# Patient Record
Sex: Male | Born: 1965 | Race: White | Hispanic: No | Marital: Married | Smoking: Former smoker
Health system: Southern US, Community
[De-identification: ages and names within clinical notes are randomized; demographics above are authoritative.]

## PROBLEM LIST (undated history)

## (undated) DIAGNOSIS — Z87891 Personal history of nicotine dependence: Secondary | ICD-10-CM

## (undated) DIAGNOSIS — G4730 Sleep apnea, unspecified: Secondary | ICD-10-CM

## (undated) DIAGNOSIS — F419 Anxiety disorder, unspecified: Secondary | ICD-10-CM

## (undated) DIAGNOSIS — R7989 Other specified abnormal findings of blood chemistry: Secondary | ICD-10-CM

## (undated) DIAGNOSIS — H9319 Tinnitus, unspecified ear: Secondary | ICD-10-CM

## (undated) DIAGNOSIS — G4733 Obstructive sleep apnea (adult) (pediatric): Secondary | ICD-10-CM

## (undated) DIAGNOSIS — F32A Depression, unspecified: Secondary | ICD-10-CM

## (undated) DIAGNOSIS — L409 Psoriasis, unspecified: Secondary | ICD-10-CM

## (undated) DIAGNOSIS — F329 Major depressive disorder, single episode, unspecified: Secondary | ICD-10-CM

## (undated) DIAGNOSIS — R945 Abnormal results of liver function studies: Secondary | ICD-10-CM

## (undated) DIAGNOSIS — E669 Obesity, unspecified: Secondary | ICD-10-CM

## (undated) DIAGNOSIS — I10 Essential (primary) hypertension: Secondary | ICD-10-CM

## (undated) DIAGNOSIS — E782 Mixed hyperlipidemia: Secondary | ICD-10-CM

## (undated) HISTORY — DX: Personal history of nicotine dependence: Z87.891

## (undated) HISTORY — DX: Psoriasis, unspecified: L40.9

## (undated) HISTORY — DX: Tinnitus, unspecified ear: H93.19

## (undated) HISTORY — PX: BACK SURGERY: SHX140

## (undated) HISTORY — DX: Obesity, unspecified: E66.9

## (undated) HISTORY — DX: Abnormal results of liver function studies: R94.5

## (undated) HISTORY — DX: Other specified abnormal findings of blood chemistry: R79.89

## (undated) HISTORY — DX: Mixed hyperlipidemia: E78.2

---

## 2000-03-02 ENCOUNTER — Encounter: Payer: Self-pay | Admitting: Emergency Medicine

## 2000-03-02 ENCOUNTER — Emergency Department (HOSPITAL_COMMUNITY): Admission: EM | Admit: 2000-03-02 | Discharge: 2000-03-02 | Payer: Self-pay | Admitting: Emergency Medicine

## 2002-12-13 HISTORY — PX: CARDIAC CATHETERIZATION: SHX172

## 2003-10-04 ENCOUNTER — Encounter: Payer: Self-pay | Admitting: Emergency Medicine

## 2003-10-04 ENCOUNTER — Observation Stay (HOSPITAL_COMMUNITY): Admission: EM | Admit: 2003-10-04 | Discharge: 2003-10-04 | Payer: Self-pay | Admitting: Emergency Medicine

## 2005-06-30 ENCOUNTER — Inpatient Hospital Stay (HOSPITAL_COMMUNITY): Admission: RE | Admit: 2005-06-30 | Discharge: 2005-07-02 | Payer: Self-pay | Admitting: Neurological Surgery

## 2005-08-03 ENCOUNTER — Encounter: Admission: RE | Admit: 2005-08-03 | Discharge: 2005-08-03 | Payer: Self-pay | Admitting: Neurological Surgery

## 2005-10-05 ENCOUNTER — Encounter: Admission: RE | Admit: 2005-10-05 | Discharge: 2005-10-05 | Payer: Self-pay | Admitting: Neurological Surgery

## 2006-02-25 ENCOUNTER — Encounter: Admission: RE | Admit: 2006-02-25 | Discharge: 2006-02-25 | Payer: Self-pay | Admitting: Neurological Surgery

## 2006-05-10 ENCOUNTER — Encounter: Admission: RE | Admit: 2006-05-10 | Discharge: 2006-05-10 | Payer: Self-pay | Admitting: Neurological Surgery

## 2007-01-20 ENCOUNTER — Encounter: Admission: RE | Admit: 2007-01-20 | Discharge: 2007-01-20 | Payer: Self-pay | Admitting: Neurological Surgery

## 2007-02-10 ENCOUNTER — Emergency Department (HOSPITAL_COMMUNITY): Admission: EM | Admit: 2007-02-10 | Discharge: 2007-02-10 | Payer: Self-pay | Admitting: Emergency Medicine

## 2007-06-02 IMAGING — CR DG CHEST 2V
2 series · 2 of 2 positions shown · non-contrast
Comparison: None

CLINICAL DATA: HNP, preop radiograph
 CHEST ? TWO VIEWS:

[view not recorded (1 of 2)]
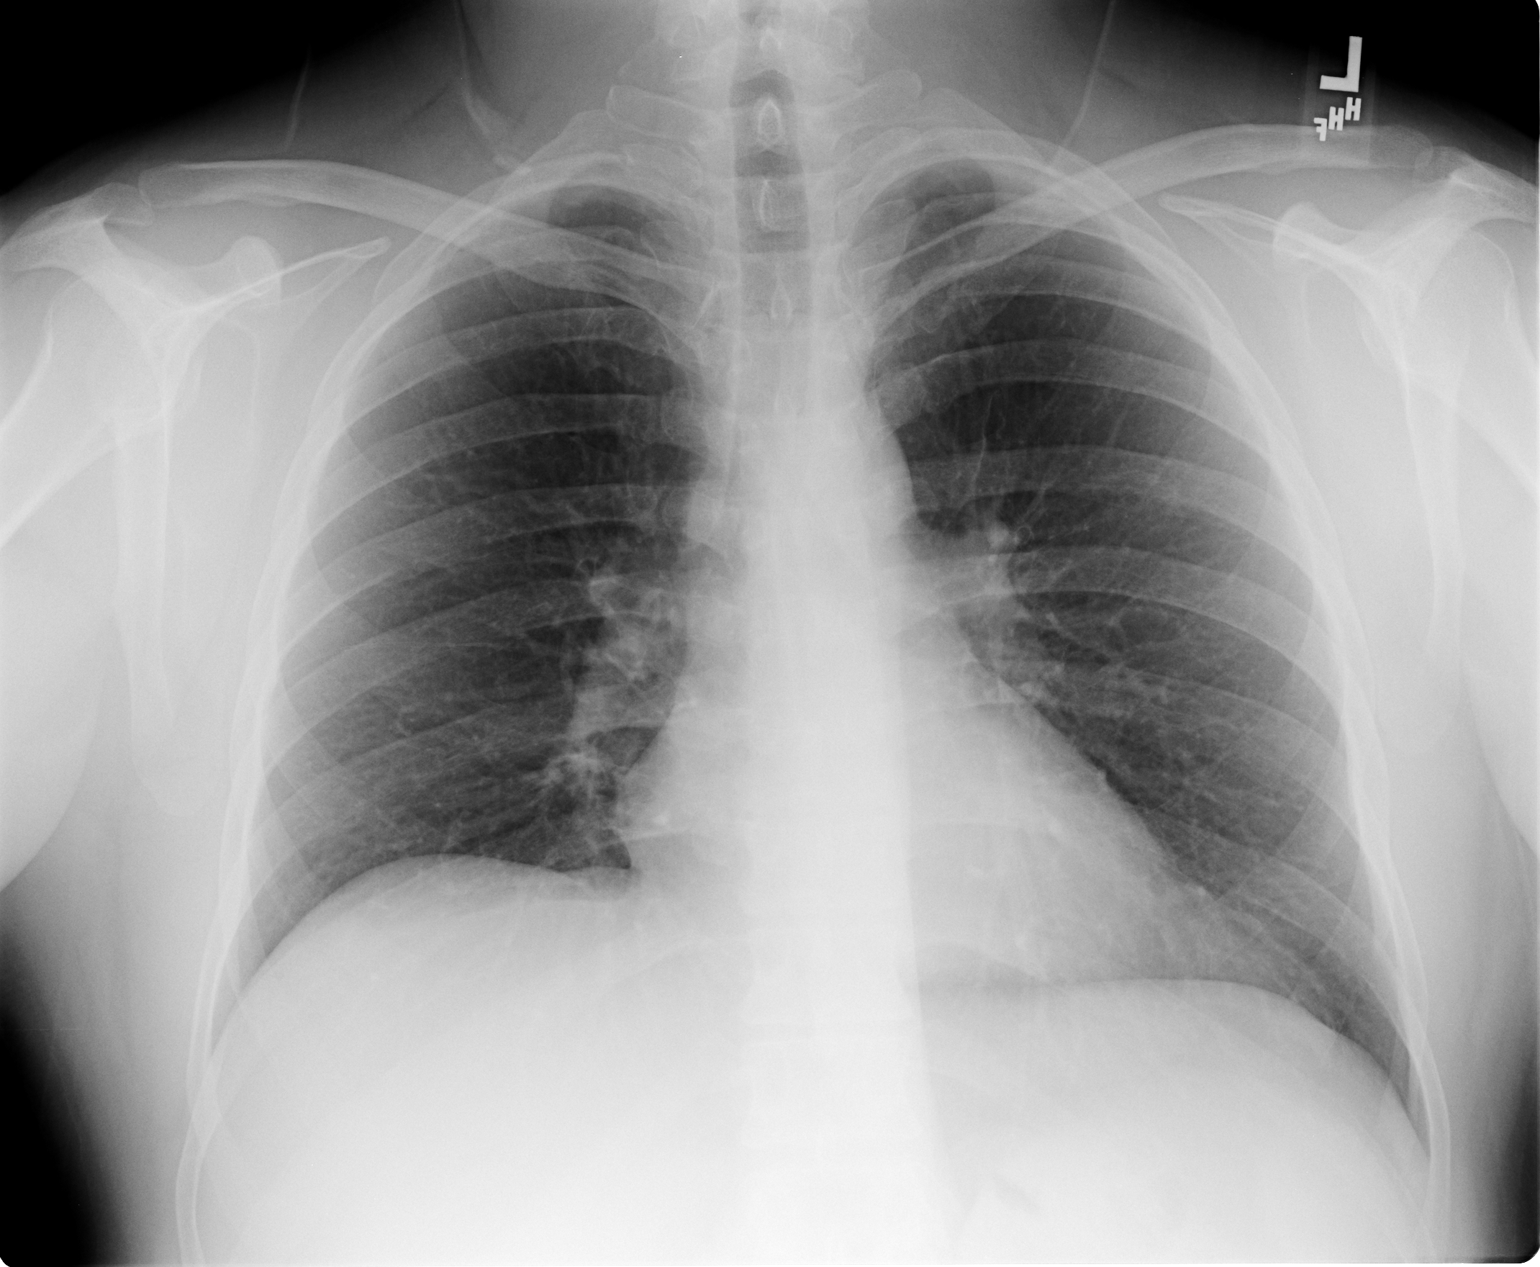

[view not recorded (2 of 2)]
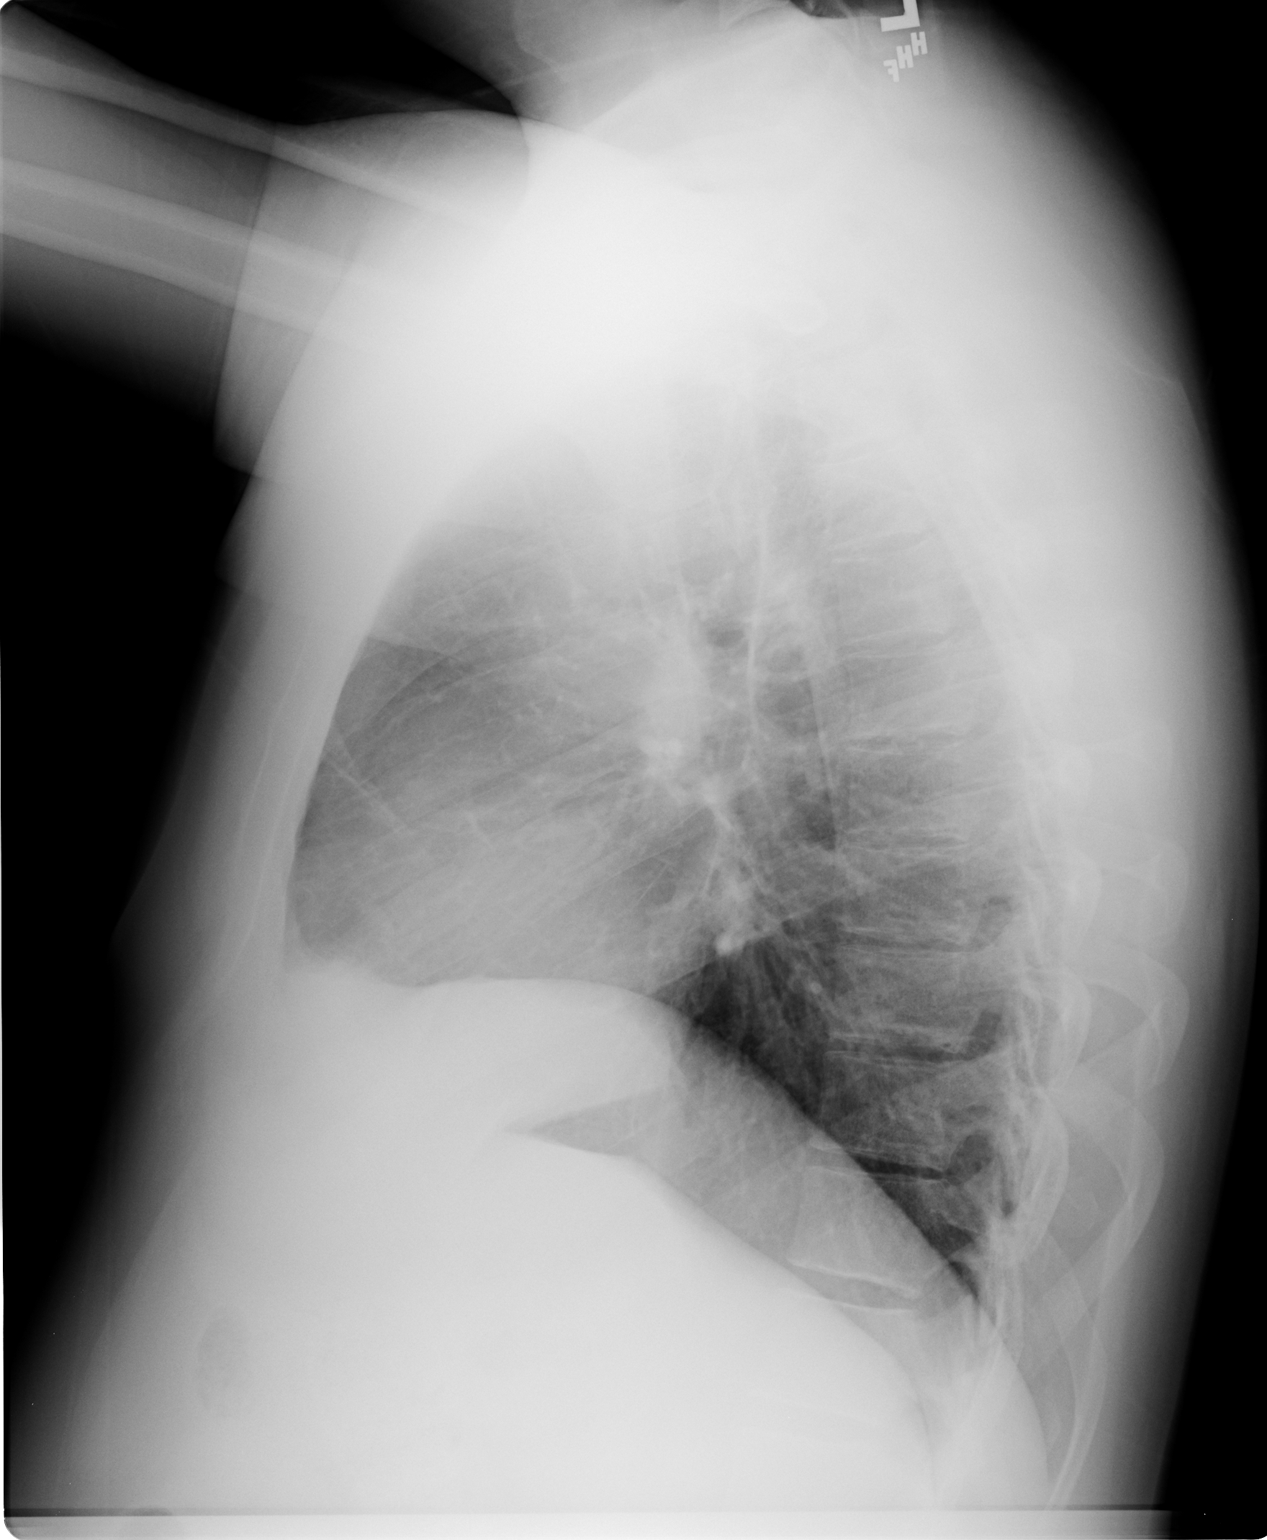

[2 of 2 positions shown; findings below may reference images not displayed]

FINDINGS: The heart size is normal.  There is no pleural effusions or pneumothorax.  No focal air space opacities are identified.
IMPRESSION: No acute cardiopulmonary abnormalities.

## 2007-06-03 IMAGING — RF DG LUMBAR SPINE 2-3V
1 series · 2 of 2 positions shown · non-contrast
Comparison: none

CLINICAL DATA: Herniated nucleus pulposa with posterior laminectomy and internal fixation
 LUMBOSACRAL SPINE ? TWO VIEWS:

[Series 1: run · 2 of 2 slices shown]
[im 1/2]
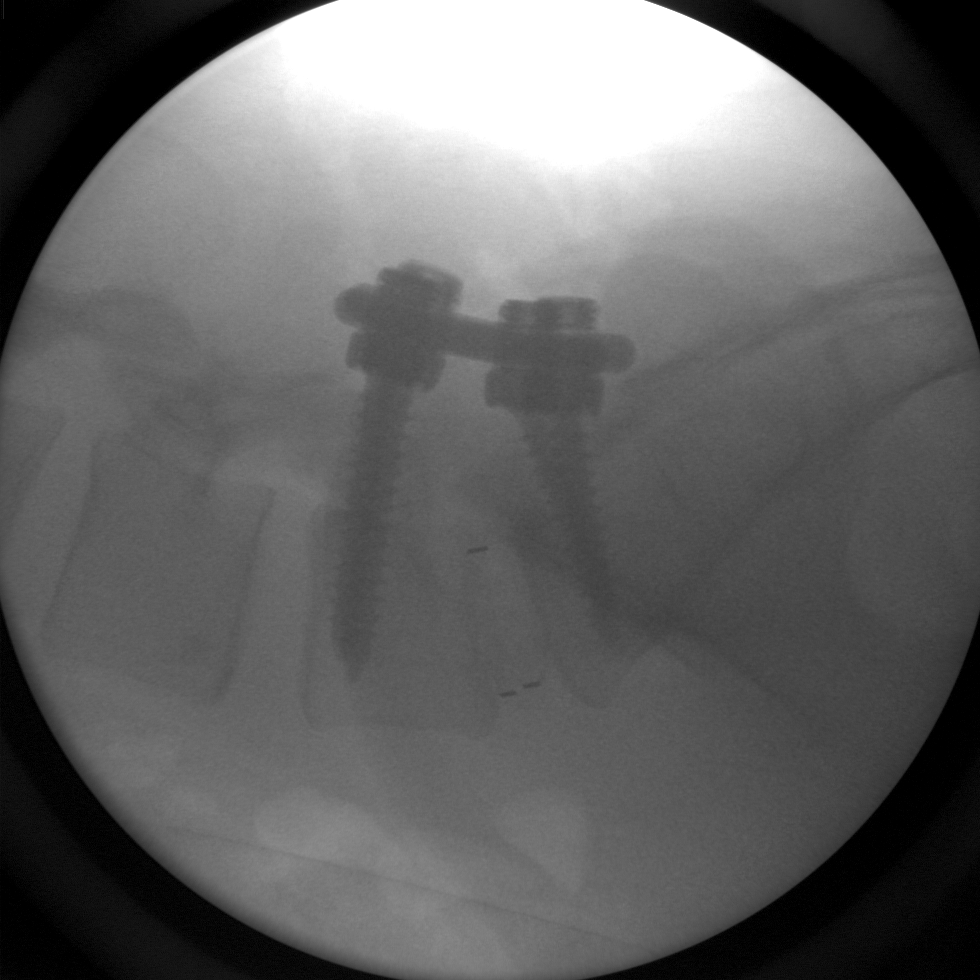
[im 2/2]
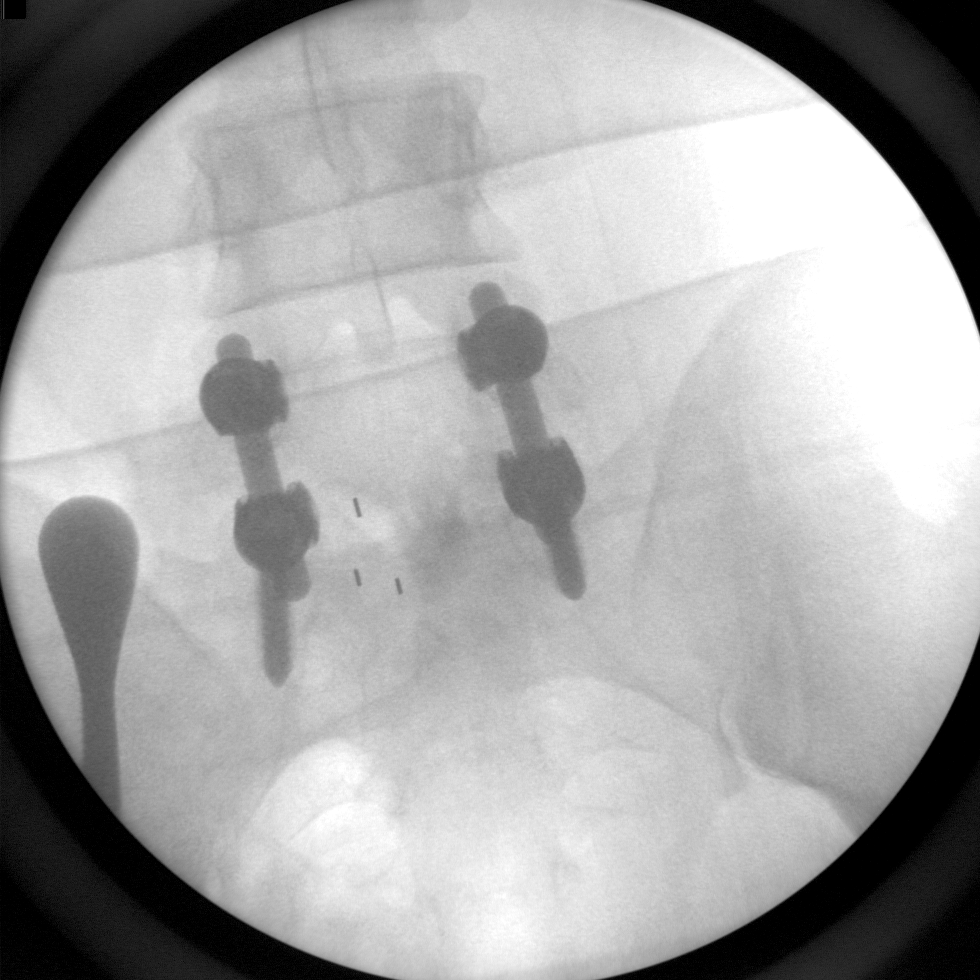

[2 of 2 positions shown; findings below may reference images not displayed]

FINDINGS: AP and lateral views of the lumbosacral spine show good position of posterior fusion devices at L5-S1.
IMPRESSION: Posterior fusion L5-S1.

## 2007-12-14 DIAGNOSIS — H9319 Tinnitus, unspecified ear: Secondary | ICD-10-CM

## 2007-12-14 HISTORY — DX: Tinnitus, unspecified ear: H93.19

## 2008-02-04 ENCOUNTER — Emergency Department (HOSPITAL_COMMUNITY): Admission: EM | Admit: 2008-02-04 | Discharge: 2008-02-04 | Payer: Self-pay | Admitting: Emergency Medicine

## 2009-04-22 ENCOUNTER — Encounter: Admission: RE | Admit: 2009-04-22 | Discharge: 2009-04-22 | Payer: Self-pay | Admitting: Neurological Surgery

## 2009-07-07 ENCOUNTER — Emergency Department (HOSPITAL_COMMUNITY): Admission: EM | Admit: 2009-07-07 | Discharge: 2009-07-07 | Payer: Self-pay | Admitting: Emergency Medicine

## 2011-04-30 NOTE — H&P (Signed)
NAME:  Allen Thomas, Allen Thomas                             ACCOUNT NO.:  [id number]   MEDICAL RECORD NO.:  [id number]                   PATIENT TYPE:  EMS   LOCATION:  [facility]                                 FACILITY:  [facility]   PHYSICIAN:  Colleen Can. Deborah Chalk, M.D.            DATE OF BIRTH:  08-24-1966   DATE OF ADMISSION:  10/04/2003  DATE OF DISCHARGE:                                HISTORY & PHYSICAL   CHIEF COMPLAINT:  Chest pain.   HISTORY OF PRESENT ILLNESS:  Mr. Scheibel is a 46 year old male who has a  known history of ongoing tobacco abuse, hyperlipidemia as well as a positive  family history for coronary disease.  He presents to the emergency  department today with left arm achiness that has been occurring over the  past three to four days.  He notes that it has basically been constant, but  he does have varying degrees of severity.  He has had no relief with  Tylenol.  Today, after awakening, he had left breast discomfort which was  very unusual for him.  He felt hot.  He had no nausea, vomiting or shortness  of breath.  He came to the emergency room where he had some relief with  aspirin and nitroglycerin.  He is subsequently admitted for further  evaluation.   PAST MEDICAL HISTORY:  1. Anxiety.  2. Hyperlipidemia.  3. Irritable bowel syndrome.   PAST SURGICAL HISTORY:  None.   CURRENT MEDICATIONS:  Librax, Ativan, Paxil, Lipitor and TriCor.   ALLERGIES:  None.   FAMILY HISTORY:  Both of his parents are in their 40's.  Father had coronary  artery bypass grafting x3 in his mid-50's.  He is currently in his early  51's.  His mother does have a history of hyperlipidemia.   SOCIAL HISTORY:  He is married, has two children.  He is employed as an  Psychologist, sport and exercise.  There is no alcohol use.  He has had tobacco  abuse of a pack a day for at least ten years, perhaps longer.   REVIEW OF SYSTEMS:  Basically as noted above and is otherwise, unremarkable.   PHYSICAL  EXAMINATION:  GENERAL:  Currently in no acute distress.  VITAL SIGNS:  Blood pressure 120/70, heart rate is in the 70's, respirations  18, he is afebrile.  SKIN:  Warm and dry, color is unremarkable.  LUNGS:  Basically clear.  HEART:  Shows a regular rhythm.  There is no chest wall tenderness.  ABDOMEN:  Soft, positive bowel sounds, nontender.  EXTREMITIES:  Without edema.  NEUROLOGIC:  Intact.  There are no gross focal deficits.   PERTINENT LABORATORY DATA:  First set of cardiac markers are negative.  EKG  is normal.  Other labs are pending.   OVERALL IMPRESSION:  1. Left arm and chest pain of questionable etiology.  2. Ongoing tobacco abuse.  3. Hyperlipidemia on combination therapy.  4. Positive family history for coronary disease.   PLAN:  He will be admitted to the hospital.  Will continue to check cardiac  enzymes.  Will place him on nitroglycerin and heparin as well as PPI  therapy.  Will make plans for him to undergo cardiac catheterization later  on today.       Juanell Fairly C. Earl Gala, N.P.                 Colleen Can. Deborah Chalk, M.D.    LCO/MEDQ  D:  10/04/2003  T:  10/04/2003  Job:  [id number]   cc:   Caryn Bee L. Little, M.D.  [address]  [place]  [place] [address]  Fax: [phone]   Colleen Can. Deborah Chalk, M.D.  Fax: [phone]

## 2011-04-30 NOTE — Cardiovascular Report (Signed)
NAME:  Allen Thomas, Allen Thomas                             ACCOUNT NO.:  [id number]   MEDICAL RECORD NO.:  [id number]                   PATIENT TYPE:  INP   LOCATION:  1826                                 FACILITY:  [facility]   PHYSICIAN:  Colleen Can. Deborah Chalk, M.D.            DATE OF BIRTH:  11-24-1966   DATE OF PROCEDURE:  10/04/2003  DATE OF DISCHARGE:                              CARDIAC CATHETERIZATION   PROCEDURES PERFORMED:  1. Left heart catheterization.  2. Selective coronary angiography.  3. Left ventricular angiography.   CARDIOLOGIST:  Colleen Can. Deborah Chalk, M.D.   HISTORY:  Mr. Livingood is a 45 year old gentleman who presents with chest  pain that has been persistent over the last three to four days.  He has a  strongly positive family history of heart disease as well as a history of  hyperlipidemia.   TYPE AND SITE OF ENTRY:  Percutaneous right femoral artery.   CATHETERS:  Six [place] 4-curved Judkins right and left coronary catheters.  Six French pigtail ventriculographic catheter with Perclose.   CONTRAST MATERIAL:  Omnipaque.   MEDICATIONS:  Medications given prior to the procedure; Valium 10 mg p.o.   Medications given during the procedure; Versed 3 mg IV and Ancef 1 gram IV.   COMMENTS:  The patient tolerated the procedure well.   HEMODYNAMIC DATA:  The aortic pressure was 120/88.  LV was 128/6-12.  There  is no aortic valve gradient noted on pullback.   ANGIOGRAPHIC DATA:  1. Left ventricular angiogram was performed in the RAO position.  Overall     cardiac size and silhouette were normal.  The global ejection fraction is     55-60%.  There is no mitral regurgitation, intracardiac calcifications or     intracavitary filling defect.   1. Coronary Arteries:  The coronary arteries the arise and distribute     normally.   1. Left Main:  The left main coronary artery is normal.   1. Left Anterior Descending:  The left anterior descending is normal.  There     is a high  large diagonal vessel; it is normal.   1. Left Circumflex:  The left circumflex is normal.   1. Right Coronary Artery:  The right coronary artery is a dominant vessel.     It is normal.   OVERALL IMPRESSION:  1. Normal left ventricular function.  2. Normal coronary arteries.   DISCUSSION:  In light of these findings it is felt that Mr. Eble chest  pain syndrome is not cardiac in nature.  We will be able to manage him on an  outpatient basis.                                                 Colleen Can. Deborah Chalk,  M.D.    SNT/MEDQ  D:  10/04/2003  T:  10/05/2003  Job:  [id number]   cc:   Caryn Bee L. Little, M.D.  [address]  [place]  [place] [address]  Fax: [phone]

## 2011-04-30 NOTE — Op Note (Signed)
Allen Thomas, Allen Thomas                 ACCOUNT NO.:  [id number]   MEDICAL RECORD NO.:  [id number]          PATIENT TYPE:  INP   LOCATION:  3007                         FACILITY:  [facility]   PHYSICIAN:  Tia Alert, MD     DATE OF BIRTH:  18-Aug-1966   DATE OF PROCEDURE:  06/30/2005  DATE OF DISCHARGE:                                 OPERATIVE REPORT   PREOPERATIVE DIAGNOSIS:  Lumbar degenerative disc disease L5-S1, with large  midline disc herniation, with back and leg pain.   POSTOPERATIVE DIAGNOSIS:  Lumbar degenerative disc disease L5-S1, with large  midline disc herniation, with back and leg pain.   PROCEDURES:  1.  Decompressive laminectomy, hemifacetectomy, and foraminotomies L5-S1,      requiring greater work than is usually required for a typical PLIF      procedure.  2.  Posterior lumbar interbody fusion L5-S1 utilizing an 8 x 26 mm Telamon      interbody cage packed with local autograft and DBX putty, and also a      tangent allograft bone wedge.  3.  Intertransverse arthrodesis L5-S1, utilizing local autograft and DBX      putty.  4.  Nonsegmental fixation L5-S1 utilizing the Legacy pedicle screw system.   SURGEON:  Tia Alert, M.D.   ASSISTANT:  Kathaleen Maser. Pool, M.D.   ANESTHESIA:  General endotracheal.   COMPLICATIONS:  None apparent.   INDICATIONS FOR PROCEDURE:  Allen Thomas is a 45 year old white male who was  referred for neurosurgical consultation regarding a larger midline disc  herniation with degenerative disc disease at L5-S1.  He had back pain worse  than leg pain that sounded discogenic in nature.  He had a very large disc  herniation causing significant spinal stenosis.  I recommended a posterior  lumbar interbody fusion at L5-S1.  He understood the risks and benefits and  expected outcome and wished to proceed.   DESCRIPTION OF THE PROCEDURE:  The patient was taken to the operating room,  and after induction of adequate general endotracheal  anesthesia, he was  rolled into the prone position on chest rolls, and all pressure points were  padded.  His lumbar region was prepped with DuraPrep and then draped in the  usual sterile fashion.  10 cc of local anesthesia was injected, and then a  dorsal midline incision was made and carried down to the lumbosacral fascia.  The fascia was opened, and the paraspinous musculature was taken down in a  subperiosteal fashion to expose the L5-S1 interspace.  Intraoperative  fluoroscopy confirmed my level, and then I used the Leksell rongeur to  remove the spinous process, and then I used it along with Kerrison punches  to perform a complete laminectomy, hemifacetectomy, and bilateral  foraminotomies at L5-S1.  The L5 and S1 nerve roots were identified and  followed out into the foramina to the medial pedicle wall.  Once the  decompression was complete, I coagulated the epidural venous vasculature and  cut this sharply.  We then performed the initial diskectomy with pituitary  rongeurs after the annulus  was incised bilaterally.  We then distracted the  disc space up to 8 mm, and then used on each side a combination of the  rotating cutter, round scraper, curettes, and pituitary rongeurs to perform  complete diskectomies.  The chisel was used to prepare the endplates for  arthrodesis.  He did have a large midline spur.  It was a calcified disc  herniation.  This required some significant retraction of the nerve roots in  order to get close to the midline in order to decompress the midline from a  large calcified disc.  Once the diskectomy was complete and the  decompression was done, and the nerve free, we used an 8 mm x 26 mm Telamon  interbody cage packed with local autograft and DBX putty and tapped this  into position on the patient's right side, packed the midline with local  autograft and DBX putty, and used an 8 x 26 mm tangent interbody bone wedge  on the left side.  Once the PLIF was  complete, we turned our attention to  the nonsegmental fixation.  We localized the pedicle screw entry zones  bilaterally, probed each pedicle, tapped each pedicle with 5.5 tap, palpated  each pedicle to ensure no break in the cortex, and then used 65 x 45 mm  pedicle screws into the L5 pedicles and 65 x 35 mm pedicle screws into the  pedicles of S1 bilaterally.  We then decorticated the transverse processes  and placed a mixture of autograft and DBX putty out over these for  intertransverse arthrodesis.  We then used two 40 mm lordotic rods and  placed these into the long T axial screw heads of the pedicle screws at L5  and S1 and locked these into position with the locking caps and the anti-  torque device after achieving compression.  We then dried all bleeding  points, irrigated with saline solution, lined the dura with Gelfoam, and  then closed the muscle and the fascia with interrupted #1 Vicryl.  A medium  Hemovac drain was placed prior to this closure.  We closed the subcutaneous  and subcuticular tissue with 2-0 and 3-0 Vicryl, and closed the skin with  Benzoin and Steri-Strips.  The drapes were removed.  A sterile dressing was  applied.  The patient was awakened from general anesthesia and transported  to the recovery room in stable condition.  At the end of the procedure, all  sponge, needle, and instrument counts were correct.       DSJ/MEDQ  D:  06/30/2005  T:  06/30/2005  Job:  [id number]

## 2011-09-06 LAB — COMPREHENSIVE METABOLIC PANEL
ALT: 61 — ABNORMAL HIGH
AST: 40 — ABNORMAL HIGH
Albumin: 4
Alkaline Phosphatase: 69
BUN: 16
CO2: 27
Creatinine, Ser: 0.84
GFR calc Af Amer: >60
GFR calc non Af Amer: >60

## 2011-09-06 LAB — CBC
MCHC: 35.7
Platelets: 368
RDW: 12.1
WBC: 7.7

## 2011-09-06 LAB — URINALYSIS, ROUTINE W REFLEX MICROSCOPIC
Bilirubin Urine: NEGATIVE
Hgb urine dipstick: NEGATIVE
Ketones, ur: NEGATIVE
Protein, ur: NEGATIVE

## 2011-09-06 LAB — DIFFERENTIAL
Eosinophils Absolute: 0.1
Lymphocytes Relative: 43
Monocytes Relative: 7
Neutro Abs: 3.7

## 2011-09-06 LAB — LIPASE, BLOOD: Lipase: 35

## 2012-03-28 ENCOUNTER — Other Ambulatory Visit: Payer: Self-pay | Admitting: Orthopaedic Surgery

## 2012-03-28 DIAGNOSIS — M25562 Pain in left knee: Secondary | ICD-10-CM

## 2012-04-01 ENCOUNTER — Ambulatory Visit
Admission: RE | Admit: 2012-04-01 | Discharge: 2012-04-01 | Disposition: A | Discharge status: Home / Self Care | Payer: BC Managed Care – PPO | Source: Ambulatory Visit | Attending: Orthopaedic Surgery | Admitting: Orthopaedic Surgery

## 2012-04-01 DIAGNOSIS — M25562 Pain in left knee: Secondary | ICD-10-CM

## 2012-05-23 ENCOUNTER — Ambulatory Visit: Payer: BC Managed Care – PPO | Attending: Orthopaedic Surgery | Admitting: Physical Therapy

## 2012-05-23 DIAGNOSIS — M25569 Pain in unspecified knee: Secondary | ICD-10-CM | POA: Insufficient documentation

## 2012-05-23 DIAGNOSIS — M25669 Stiffness of unspecified knee, not elsewhere classified: Secondary | ICD-10-CM | POA: Insufficient documentation

## 2012-05-23 DIAGNOSIS — IMO0001 Reserved for inherently not codable concepts without codable children: Secondary | ICD-10-CM | POA: Insufficient documentation

## 2012-05-30 ENCOUNTER — Ambulatory Visit: Payer: BC Managed Care – PPO | Admitting: Physical Therapy

## 2012-06-01 ENCOUNTER — Ambulatory Visit: Payer: BC Managed Care – PPO

## 2012-06-06 ENCOUNTER — Ambulatory Visit: Payer: BC Managed Care – PPO | Admitting: Physical Therapy

## 2012-06-13 ENCOUNTER — Ambulatory Visit: Payer: BC Managed Care – PPO | Admitting: Rehabilitation

## 2012-07-24 ENCOUNTER — Encounter (HOSPITAL_COMMUNITY): Payer: Self-pay | Admitting: *Deleted

## 2012-07-24 ENCOUNTER — Other Ambulatory Visit (HOSPITAL_COMMUNITY): Payer: Self-pay | Admitting: Orthopaedic Surgery

## 2012-07-25 ENCOUNTER — Encounter (HOSPITAL_COMMUNITY): Payer: Self-pay | Admitting: Pharmacy Technician

## 2012-07-27 ENCOUNTER — Encounter (HOSPITAL_COMMUNITY): Payer: Self-pay | Admitting: Certified Registered Nurse Anesthetist

## 2012-07-27 ENCOUNTER — Ambulatory Visit (HOSPITAL_COMMUNITY): Payer: BC Managed Care – PPO | Admitting: Certified Registered Nurse Anesthetist

## 2012-07-27 ENCOUNTER — Encounter (HOSPITAL_COMMUNITY)
Admission: RE | Disposition: A | Discharge status: Home / Self Care | Payer: Self-pay | Source: Ambulatory Visit | Attending: Orthopaedic Surgery

## 2012-07-27 ENCOUNTER — Ambulatory Visit (HOSPITAL_COMMUNITY): Payer: BC Managed Care – PPO

## 2012-07-27 ENCOUNTER — Encounter (HOSPITAL_COMMUNITY): Payer: Self-pay

## 2012-07-27 ENCOUNTER — Ambulatory Visit (HOSPITAL_COMMUNITY)
Admission: RE | Admit: 2012-07-27 | Discharge: 2012-07-27 | Disposition: A | Discharge status: Home / Self Care | Payer: BC Managed Care – PPO | Source: Ambulatory Visit | Attending: Orthopaedic Surgery | Admitting: Orthopaedic Surgery

## 2012-07-27 DIAGNOSIS — M2240 Chondromalacia patellae, unspecified knee: Secondary | ICD-10-CM | POA: Insufficient documentation

## 2012-07-27 DIAGNOSIS — X58XXXA Exposure to other specified factors, initial encounter: Secondary | ICD-10-CM | POA: Insufficient documentation

## 2012-07-27 DIAGNOSIS — R7309 Other abnormal glucose: Secondary | ICD-10-CM | POA: Insufficient documentation

## 2012-07-27 DIAGNOSIS — Z79899 Other long term (current) drug therapy: Secondary | ICD-10-CM | POA: Insufficient documentation

## 2012-07-27 DIAGNOSIS — M2390 Unspecified internal derangement of unspecified knee: Secondary | ICD-10-CM

## 2012-07-27 DIAGNOSIS — IMO0002 Reserved for concepts with insufficient information to code with codable children: Secondary | ICD-10-CM | POA: Insufficient documentation

## 2012-07-27 DIAGNOSIS — G4730 Sleep apnea, unspecified: Secondary | ICD-10-CM | POA: Insufficient documentation

## 2012-07-27 DIAGNOSIS — M659 Unspecified synovitis and tenosynovitis, unspecified site: Secondary | ICD-10-CM | POA: Insufficient documentation

## 2012-07-27 DIAGNOSIS — I10 Essential (primary) hypertension: Secondary | ICD-10-CM | POA: Insufficient documentation

## 2012-07-27 HISTORY — DX: Sleep apnea, unspecified: G47.30

## 2012-07-27 HISTORY — DX: Essential (primary) hypertension: I10

## 2012-07-27 HISTORY — DX: Depression, unspecified: F32.A

## 2012-07-27 HISTORY — DX: Major depressive disorder, single episode, unspecified: F32.9

## 2012-07-27 HISTORY — PX: KNEE ARTHROSCOPY: SHX127

## 2012-07-27 HISTORY — DX: Anxiety disorder, unspecified: F41.9

## 2012-07-27 LAB — BASIC METABOLIC PANEL
CO2: 28 mEq/L (ref 19–32)
Chloride: 106 mEq/L (ref 96–112)
GFR calc Af Amer: >90 mL/min (ref >90)
Sodium: 142 mEq/L (ref 135–145)

## 2012-07-27 LAB — SURGICAL PCR SCREEN
MRSA, PCR: NEGATIVE
Staphylococcus aureus: NEGATIVE

## 2012-07-27 LAB — GLUCOSE, CAPILLARY: Glucose-Capillary: 84 mg/dL (ref 70–99)

## 2012-07-27 LAB — CBC
Platelets: 299 10*3/uL (ref 150–400)
RBC: 4.38 MIL/uL (ref 4.22–5.81)
RDW: 12.2 % (ref 11.5–15.5)
WBC: 5.8 10*3/uL (ref 4.0–10.5)

## 2012-07-27 SURGERY — ARTHROSCOPY, KNEE
Anesthesia: General | Laterality: Left | Wound class: Clean

## 2012-07-27 MED ORDER — PROPOFOL 10 MG/ML IV EMUL
INTRAVENOUS | Status: DC | PRN
  Administered 2012-07-27: 100 mg via INTRAVENOUS
  Administered 2012-07-27: 300 mg via INTRAVENOUS

## 2012-07-27 MED ORDER — MIDAZOLAM HCL 5 MG/5ML IJ SOLN
INTRAMUSCULAR | Status: DC | PRN
  Administered 2012-07-27: 2 mg via INTRAVENOUS

## 2012-07-27 MED ORDER — MUPIROCIN 2 % EX OINT
TOPICAL_OINTMENT | Freq: Two times a day (BID) | CUTANEOUS | Status: DC
  Filled 2012-07-27: qty 22

## 2012-07-27 MED ORDER — OXYCODONE-ACETAMINOPHEN 5-325 MG PO TABS: 1.0000 | ORAL_TABLET | ORAL | Status: AC | PRN

## 2012-07-27 MED ORDER — ONDANSETRON HCL 4 MG/2ML IJ SOLN
INTRAMUSCULAR | Status: DC | PRN
  Administered 2012-07-27: 4 mg via INTRAVENOUS

## 2012-07-27 MED ORDER — MEPERIDINE HCL 50 MG/ML IJ SOLN: 6.2500 mg | INTRAMUSCULAR | Status: DC | PRN

## 2012-07-27 MED ORDER — HYDROMORPHONE HCL PF 1 MG/ML IJ SOLN
0.2500 mg | INTRAMUSCULAR | Status: DC | PRN
  Administered 2012-07-27 (×2): 0.5 mg via INTRAVENOUS

## 2012-07-27 MED ORDER — BUPIVACAINE HCL (PF) 0.25 % IJ SOLN
INTRAMUSCULAR | Status: AC
  Filled 2012-07-27: qty 30

## 2012-07-27 MED ORDER — PROMETHAZINE HCL 25 MG/ML IJ SOLN
6.2500 mg | INTRAMUSCULAR | Status: DC | PRN
Start: 2012-07-27 — End: 2012-07-27

## 2012-07-27 MED ORDER — ACETAMINOPHEN 10 MG/ML IV SOLN: 1000.0000 mg | Freq: Once | INTRAVENOUS | Status: DC | PRN

## 2012-07-27 MED ORDER — LACTATED RINGERS IV SOLN
INTRAVENOUS | Status: DC
  Administered 2012-07-27: 1000 mL via INTRAVENOUS

## 2012-07-27 MED ORDER — HYDROMORPHONE HCL PF 1 MG/ML IJ SOLN
INTRAMUSCULAR | Status: AC
  Filled 2012-07-27: qty 1

## 2012-07-27 MED ORDER — BUPIVACAINE HCL (PF) 0.5 % IJ SOLN
INTRAMUSCULAR | Status: DC | PRN
  Administered 2012-07-27: 30 mL

## 2012-07-27 MED ORDER — FENTANYL CITRATE 0.05 MG/ML IJ SOLN
INTRAMUSCULAR | Status: DC | PRN
  Administered 2012-07-27 (×5): 50 ug via INTRAVENOUS

## 2012-07-27 MED ORDER — OXYCODONE HCL 5 MG PO TABS: 5.0000 mg | ORAL_TABLET | Freq: Once | ORAL | Status: DC | PRN

## 2012-07-27 MED ORDER — CEFAZOLIN SODIUM-DEXTROSE 2-3 GM-% IV SOLR
2.0000 g | INTRAVENOUS | Status: AC
  Administered 2012-07-27: 2 g via INTRAVENOUS

## 2012-07-27 MED ORDER — MORPHINE SULFATE 4 MG/ML IJ SOLN
INTRAMUSCULAR | Status: AC
  Filled 2012-07-27: qty 1

## 2012-07-27 MED ORDER — LACTATED RINGERS IR SOLN
Status: DC | PRN
  Administered 2012-07-27 (×2): 3000 mL

## 2012-07-27 MED ORDER — OXYCODONE HCL 5 MG/5ML PO SOLN
5.0000 mg | Freq: Once | ORAL | Status: DC | PRN
  Filled 2012-07-27: qty 5

## 2012-07-27 MED ORDER — BUPIVACAINE HCL (PF) 0.5 % IJ SOLN
INTRAMUSCULAR | Status: AC
  Filled 2012-07-27: qty 30

## 2012-07-27 MED ORDER — MORPHINE SULFATE 4 MG/ML IJ SOLN
INTRAMUSCULAR | Status: DC | PRN
  Administered 2012-07-27: 4 mg via INTRAVENOUS

## 2012-07-27 MED ORDER — CEFAZOLIN SODIUM-DEXTROSE 2-3 GM-% IV SOLR
INTRAVENOUS | Status: AC
  Filled 2012-07-27: qty 50

## 2012-07-27 MED ORDER — LIDOCAINE HCL (CARDIAC) 20 MG/ML IV SOLN
INTRAVENOUS | Status: DC | PRN
  Administered 2012-07-27: 100 mg via INTRAVENOUS

## 2012-07-27 SURGICAL SUPPLY — 25 items
BLADE CUDA SHAVER 3.5 (BLADE) ×2 IMPLANT
CLOTH BEACON ORANGE TIMEOUT ST (SAFETY) ×2 IMPLANT
CUFF TOURN SGL QUICK 34 (TOURNIQUET CUFF) ×2
CUFF TRNQT CYL 34X4X40X1 (TOURNIQUET CUFF) ×1 IMPLANT
DRAPE U-SHAPE 47X51 STRL (DRAPES) ×2 IMPLANT
DRSG PAD ABDOMINAL 8X10 ST (GAUZE/BANDAGES/DRESSINGS) ×2 IMPLANT
DURAPREP 26ML APPLICATOR (WOUND CARE) ×2 IMPLANT
GAUZE XEROFORM 1X8 LF (GAUZE/BANDAGES/DRESSINGS) ×2 IMPLANT
GLOVE BIOGEL PI IND STRL 8 (GLOVE) ×1 IMPLANT
GLOVE BIOGEL PI INDICATOR 8 (GLOVE) ×1
GLOVE ORTHO TXT STRL SZ7.5 (GLOVE) ×2 IMPLANT
GOWN STRL REIN XL XLG (GOWN DISPOSABLE) ×2 IMPLANT
IV LACTATED RINGER IRRG 3000ML (IV SOLUTION) ×8
IV LR IRRIG 3000ML ARTHROMATIC (IV SOLUTION) ×4 IMPLANT
MANIFOLD NEPTUNE II (INSTRUMENTS) ×2 IMPLANT
PACK ARTHROSCOPY WL (CUSTOM PROCEDURE TRAY) ×2 IMPLANT
PADDING CAST COTTON 6X4 STRL (CAST SUPPLIES) ×2 IMPLANT
POSITIONER SURGICAL ARM (MISCELLANEOUS) ×4 IMPLANT
SUT ETHILON 4 0 PS 2 18 (SUTURE) ×2 IMPLANT
SYR 20CC LL (SYRINGE) ×2 IMPLANT
SYR CONTROL 10ML LL (SYRINGE) ×2 IMPLANT
TOWEL OR 17X26 10 PK STRL BLUE (TOWEL DISPOSABLE) ×4 IMPLANT
TUBING CONNECTING 10 (TUBING) ×2 IMPLANT
WAND 90 DEG TURBOVAC W/CORD (SURGICAL WAND) IMPLANT
WRAP KNEE MAXI GEL POST OP (GAUZE/BANDAGES/DRESSINGS) ×2 IMPLANT

## 2012-07-27 NOTE — Transfer of Care (Signed)
Immediate Anesthesia Transfer of Care Note  Patient: Allen Thomas  Procedure(s) Performed: Procedure(s) (LRB): ARTHROSCOPY KNEE (Left)  Patient Location: PACU  Anesthesia Type: General  Level of Consciousness: sedated, patient cooperative and responds to stimulaton  Airway & Oxygen Therapy: Patient Spontanous Breathing and Patient connected to face mask oxgen  Post-op Assessment: Report given to PACU RN and Post -op Vital signs reviewed and stable  Post vital signs: Reviewed and stable  Complications: No apparent anesthesia complications

## 2012-07-27 NOTE — Anesthesia Postprocedure Evaluation (Signed)
Anesthesia Post Note  Patient: Allen Thomas  Procedure(s) Performed: Procedure(s) (LRB): ARTHROSCOPY KNEE (Left)  Anesthesia type: General  Patient location: PACU  Post pain: Pain level controlled  Post assessment: Post-op Vital signs reviewed  Last Vitals: BP 124/84  Pulse 97  Temp 37 C (Oral)  Resp 16  Ht 5\' 11"  (1.803 m)  Wt 214 lb 6 oz (97.24 kg)  BMI 29.90 kg/m2  SpO2 97%  Post vital signs: Reviewed  Level of consciousness: sedated  Complications: No apparent anesthesia complications

## 2012-07-27 NOTE — Anesthesia Preprocedure Evaluation (Addendum)
Anesthesia Evaluation  Patient identified by MRN, date of birth, ID band Patient awake    Airway Mallampati: I TM Distance: >3 FB Neck ROM: Full    Dental  (+) Teeth Intact and Dental Advisory Given   Pulmonary sleep apnea ,  breath sounds clear to auscultation  Pulmonary exam normal       Cardiovascular hypertension, Pt. on medications Rhythm:Regular Rate:Normal     Neuro/Psych    GI/Hepatic   Endo/Other    Renal/GU      Musculoskeletal   Abdominal (+)  Abdomen: soft.    Peds  Hematology   Anesthesia Other Findings   Reproductive/Obstetrics                          Anesthesia Physical Anesthesia Plan  ASA: III  Anesthesia Plan: General   Post-op Pain Management:    Induction: Intravenous  Airway Management Planned: LMA  Additional Equipment:   Intra-op Plan:   Post-operative Plan: Extubation in OR  Informed Consent: I have reviewed the patients History and Physical, chart, labs and discussed the procedure including the risks, benefits and alternatives for the proposed anesthesia with the patient or authorized representative who has indicated his/her understanding and acceptance.   Dental advisory given  Plan Discussed with: CRNA and Surgeon  Anesthesia Plan Comments:        Anesthesia Quick Evaluation

## 2012-07-27 NOTE — Brief Op Note (Signed)
07/27/2012  12:59 PM  PATIENT:  Allen Thomas  46 y.o. male  PRE-OPERATIVE DIAGNOSIS:  Left knee internal derangement  POST-OPERATIVE DIAGNOSIS:  Left knee internal derangement  PROCEDURE:  Procedure(s) (LRB): ARTHROSCOPY KNEE (Left)' partial medical meniscectomy  SURGEON:  Surgeon(s) and Role:    * Kathryne Hitch, MD - Primary  PHYSICIAN ASSISTANT:   ASSISTANTS: none   ANESTHESIA:   local and general  EBL:  Total I/O In: 1000 [I.V.:1000] Out: -   BLOOD ADMINISTERED:none  DRAINS: none   LOCAL MEDICATIONS USED:  MARCAINE     SPECIMEN:  No Specimen  DISPOSITION OF SPECIMEN:  N/A  COUNTS:  YES  TOURNIQUET:  * No tourniquets in log *  DICTATION: .Other Dictation: Dictation Number [id number]  PLAN OF CARE: Discharge to home after PACU  PATIENT DISPOSITION:  PACU - hemodynamically stable.   Delay start of Pharmacological VTE agent (>24hrs) due to surgical blood loss or risk of bleeding: not applicable

## 2012-07-27 NOTE — Preoperative (Signed)
Beta Blockers   Reason not to administer Beta Blockers:Not Applicable 

## 2012-07-27 NOTE — H&P (Signed)
Allen Thomas is an 46 y.o. male.   Chief Complaint:   Severe left knee pain with locking and catching HPI:   46 yo male with worsening left knee pain.  Has had rest, NSAIDs, injections and time with no improvement.  A MRI showed no significant abnormality, but his symptoms are worsening.  He wishes to proceed with a left knee arthroscopy to assess what may be going on given the failure of conservative treatment and his symptoms.  He understands the risks of blood loss, nerve and cartilage injury as well as DVT.  Past Medical History  Diagnosis Date  . Hypertension   . Anxiety   . Depression   . Diabetes mellitus     prediabetic   . Sleep apnea     settings at 11     Past Surgical History  Procedure Date  . Back surgery     L5-S1 back surgery     History reviewed. No pertinent family history. Social History:  reports that he has quit smoking. He has quit using smokeless tobacco. He reports that he drinks alcohol. He reports that he does not use illicit drugs.  Allergies: No Known Allergies  Medications Prior to Admission  Medication Sig Dispense Refill  . amLODipine (NORVASC) 10 MG tablet Take 10 mg by mouth every morning.       . Choline Fenofibrate (TRILIPIX) 135 MG capsule Take 135 mg by mouth every evening.       . clidinium-chlordiazePOXIDE (LIBRAX) 2.5-5 MG per capsule Take 1 capsule by mouth 2 (two) times daily.      Marland Kitchen Coal Tar Extract (PSORIASIN) 1.25 % GEL Apply topically as needed. 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 lisinopril (PRINIVIL,ZESTRIL) 10 MG tablet Take 10 mg by mouth every morning.       Marland Kitchen LORazepam (ATIVAN) 0.5 MG tablet Take 0.25 mg by mouth every 6 (six) hours as needed. Anxiety      . omega-3 acid ethyl esters (LOVAZA) 1 G capsule Take 4 g by mouth every evening.       Marland Kitchen PARoxetine (PAXIL) 40 MG tablet Take 40 mg by mouth every evening.       . rosuvastatin (CRESTOR) 40 MG tablet Take 40 mg by mouth every evening.       . traMADol (ULTRAM) 50 MG tablet Take  50 mg by mouth every 6 (six) hours as needed. Pain        No results found for this or any previous visit (from the past 48 hour(s)). No results found.  Review of Systems  All other systems reviewed and are negative.    Blood pressure 130/81, pulse 84, temperature 98.6 F (37 C), temperature source Oral, resp. rate 18, height 5\' 11"  (1.803 m), weight 97.24 kg (214 lb 6 oz), SpO2 97.00%. Physical Exam  Constitutional: He is oriented to person, place, and time. He appears well-developed and well-nourished.  HENT:  Head: Normocephalic and atraumatic.  Eyes: EOM are normal. Pupils are equal, round, and reactive to light.  Neck: Normal range of motion. Neck supple.  Cardiovascular: Normal rate and regular rhythm.   Respiratory: Effort normal and breath sounds normal.  GI: Soft. Bowel sounds are normal.  Musculoskeletal:       Left knee: tenderness found. Medial joint line tenderness noted.  Neurological: He is alert and oriented to person, place, and time.  Skin: Skin is warm and dry.  Psychiatric: He has a normal mood and affect.  Assessment/Plan Severe left knee pain 1) due to the failure of conservative treatment, we will proceed with a diagnostic and hopefully therapeutic left knee arthroscopy  BLACKMAN,CHRISTOPHER Y 07/27/2012, 10:03 AM

## 2012-07-28 ENCOUNTER — Encounter (HOSPITAL_COMMUNITY): Payer: Self-pay | Admitting: Orthopaedic Surgery

## 2012-07-28 NOTE — Op Note (Signed)
NAME:  Allen Thomas, Allen Thomas NO.:  [id number]  MEDICAL RECORD NO.:  [id number]  LOCATION:  [facility]                         FACILITY:  [facility]  PHYSICIAN:  Vanita Panda. Magnus Ivan, M.D.DATE OF BIRTH:  December 07, 1966  DATE OF PROCEDURE:  07/27/2012 DATE OF DISCHARGE:  07/27/2012                              OPERATIVE REPORT   PREOPERATIVE DIAGNOSIS:  Left knee pain with questionable internal derangement.  POSTOPERATIVE DIAGNOSIS:  Left knee posterior horn to midbody medial meniscal tear.  PROCEDURE:  Left knee arthroscopy with debridement and partial medial meniscectomy.  SURGEON:  Vanita Panda. Magnus Ivan, M.D.  ANESTHESIA: 1. General. 2. Local with 0.25% plain Marcaine and 4 mg of morphine.  BLOOD LOSS:  Minimal.  COMPLICATIONS:  None.  INDICATIONS:  Allen Thomas is a 46 year old gentleman who has had of left knee pain for a while on the medial aspect of his knee.  We did obtain an MRI that did not show any meniscal tear.  There were some signal changes in the meniscus as we tried to treat this conservatively with injections and physical therapy and anti-inflammatories, rest, and time. This pain actually started to get worsened.  He was developed locking and catching.  He wished to proceed with an arthroscopic intervention. I agreed with this.  Given his clinical exam, we are proceeding with surgery.  PROCEDURE:  After informed consent was obtained, appropriate left knee was marked, brought to the operating room, placed on the operative table.  General anesthesia was obtained.  His left leg was prepped and draped with the knee from the thigh down the ankle with DuraPrep and sterile drapes including a sterile stockinette with the bed raised, and the lateral leg post was utilized.  The left knee was flexed off the side of the table.  A time-out was called to identify the correct patient, correct left knee.  I then examined the knee ligamentously and did not find any  problems with the ligaments before proceeding.  I made an anterolateral arthroscopy portal, inserted the cannula into the knee and went directly to the medial compartment.  There was significant synovitis from the medial gutter of the knee.  When I got to the back of the meniscus sure enough there was a large tear of the meniscus.  There was a radial component at the mid body, and then a slight horizontal component of the posterior horn.  Using arthroscopic shaver and up- cutting biters, I was able to debride the meniscus with the partial medial meniscectomy, and we still left with meniscal tissue as I was able to probe.  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  There were no areas of exposed bone in the ACL and PCL were probed and found to be intact within the figure-of-four position. The lateral compartment was pristine.  The patellofemoral joint also was proceeding other than some synovitis in the superior pouch allowed fluid to lavage to the knee.  Then, drained fluid from the knee.  I closed the portal sites with interrupted nylon suture and Xeroform.  I infiltrated a mixture of morphine and Marcaine into the knee.  Placed a well-padded sterile dressing.  He was awakened and extubated to recovery room in stable condition.  All final counts were correct and no complications noted.     Vanita Panda. Magnus Ivan, M.D.     CYB/MEDQ  D:  07/27/2012  T:  07/28/2012  Job:  [id number]

## 2012-12-10 ENCOUNTER — Ambulatory Visit (INDEPENDENT_AMBULATORY_CARE_PROVIDER_SITE_OTHER): Payer: BC Managed Care – PPO | Admitting: Emergency Medicine

## 2012-12-10 VITALS — BP 132/82 | HR 90 | Temp 98.7°F | Resp 16 | Ht 71.0 in | Wt 221.0 lb

## 2012-12-10 DIAGNOSIS — J209 Acute bronchitis, unspecified: Secondary | ICD-10-CM

## 2012-12-10 DIAGNOSIS — R059 Cough, unspecified: Secondary | ICD-10-CM

## 2012-12-10 DIAGNOSIS — R05 Cough: Secondary | ICD-10-CM

## 2012-12-10 LAB — POCT INFLUENZA A/B
Influenza A, POC: NEGATIVE
Influenza B, POC: NEGATIVE

## 2012-12-10 MED ORDER — HYDROCOD POLST-CHLORPHEN POLST 10-8 MG/5ML PO LQCR: 5.0000 mL | Freq: Two times a day (BID) | ORAL | Status: DC | PRN

## 2012-12-10 MED ORDER — AZITHROMYCIN 250 MG PO TABS: ORAL_TABLET | ORAL | Status: DC

## 2012-12-10 MED ORDER — PSEUDOEPHEDRINE-GUAIFENESIN ER 60-600 MG PO TB12: 1.0000 | ORAL_TABLET | Freq: Two times a day (BID) | ORAL | Status: DC

## 2012-12-10 NOTE — Progress Notes (Signed)
[facility] [address] [place] [address] [phone]  Date:  12/10/2012   Name:  Allen Thomas   DOB:  [birth year]   MRN:  [id number]  PCP:  No primary provider on file.    Chief Complaint: Cough   History of Present Illness:  Allen Thomas is a 46 y.o. very pleasant male patient who presents with the following:  Malaise and cough.  Nasal congestion and post nasal drainage.  Cough productive purulent nasal drainage.  No fever or chills.  No nausea or vomiting.  No shortness of breath or wheezing. No flu shot.  Multiple ill at home.  Has OSA  Patient Active Problem List  Diagnosis  . Internal derangement of knee joint    Past Medical History  Diagnosis Date  . Hypertension   . Anxiety   . Depression   . Diabetes mellitus     prediabetic   . Sleep apnea     settings at 11     Past Surgical History  Procedure Date  . Back surgery     L5-S1 back surgery   . Knee arthroscopy 07/27/2012    Procedure: ARTHROSCOPY KNEE;  Surgeon: Kathryne Hitch, MD;  Location: [facility];  Service: Orthopedics;  Laterality: Left;  Left knee arthroscopy with debridement and medial menisectomy    History  Substance Use Topics  . Smoking status: Former Games developer  . Smokeless tobacco: Former Neurosurgeon  . Alcohol Use: Yes     Comment: rare    No family history on file.  No Known Allergies  Medication list has been reviewed and updated.  Current Outpatient Prescriptions on File Prior to Visit  Medication Sig Dispense Refill  . amLODipine (NORVASC) 10 MG tablet Take 10 mg by mouth every morning.       . Choline Fenofibrate (TRILIPIX) 135 MG capsule Take 135 mg by mouth every evening.       . clidinium-chlordiazePOXIDE (LIBRAX) 2.5-5 MG per capsule Take 1 capsule by mouth 2 (two) times daily.      Marland Kitchen Coal Tar Extract (PSORIASIN) 1.25 % GEL Apply topically as needed. 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 lisinopril (PRINIVIL,ZESTRIL) 10 MG tablet Take 10 mg by mouth every  morning.       Marland Kitchen LORazepam (ATIVAN) 0.5 MG tablet Take 0.25 mg by mouth every 6 (six) hours as needed. Anxiety      . omega-3 acid ethyl esters (LOVAZA) 1 G capsule Take 4 g by mouth every evening.       Marland Kitchen PARoxetine (PAXIL) 40 MG tablet Take 40 mg by mouth every evening.       . rosuvastatin (CRESTOR) 40 MG tablet Take 40 mg by mouth every evening.       . traMADol (ULTRAM) 50 MG tablet Take 50 mg by mouth every 6 (six) hours as needed. Pain        Review of Systems:  As per HPI, otherwise negative.    Physical Examination: Filed Vitals:   12/10/12 0812  BP: 132/82  Pulse: 90  Temp: 98.7 F (37.1 C)  Resp: 16   Filed Vitals:   12/10/12 0812  Height: 5\' 11"  (1.803 m)  Weight: 221 lb (100.245 kg)   Body mass index is 30.82 kg/(m^2). Ideal Body Weight: Weight in (lb) to have BMI = 25: 178.9   GEN: WDWN, NAD, Non-toxic, A & O x 3 HEENT: Atraumatic, Normocephalic. Neck supple. No masses, No  LAD. Ears and Nose: No external deformity. CV: RRR, No M/G/R. No JVD. No thrill. No extra heart sounds. PULM: CTA B, no wheezes, crackles, rhonchi. No retractions. No resp. distress. No accessory muscle use. ABD: S, NT, ND, +BS. No rebound. No HSM. EXTR: No c/c/e NEURO Normal gait.  PSYCH: Normally interactive. Conversant. Not depressed or anxious appearing.  Calm demeanor.    Assessment and Plan: cough  Carmelina Dane, MD  Results for orders placed in visit on 12/10/12  POCT INFLUENZA A/B      Component Value Range   Influenza A, POC Negative     Influenza B, POC Negative

## 2012-12-13 ENCOUNTER — Ambulatory Visit: Payer: BC Managed Care – PPO

## 2012-12-13 ENCOUNTER — Ambulatory Visit (INDEPENDENT_AMBULATORY_CARE_PROVIDER_SITE_OTHER): Payer: BC Managed Care – PPO | Admitting: Family Medicine

## 2012-12-13 VITALS — BP 110/75 | HR 114 | Temp 99.7°F | Resp 18 | Ht 71.0 in | Wt 227.0 lb

## 2012-12-13 DIAGNOSIS — D649 Anemia, unspecified: Secondary | ICD-10-CM

## 2012-12-13 DIAGNOSIS — R059 Cough, unspecified: Secondary | ICD-10-CM

## 2012-12-13 DIAGNOSIS — J111 Influenza due to unidentified influenza virus with other respiratory manifestations: Secondary | ICD-10-CM

## 2012-12-13 DIAGNOSIS — R05 Cough: Secondary | ICD-10-CM

## 2012-12-13 LAB — POCT CBC
Hemoglobin: 11.8 g/dL — AB (ref 14.1–18.1)
MCH, POC: 29.4 pg (ref 27–31.2)
MCHC: 32.5 g/dL (ref 31.8–35.4)
MID (cbc): 0.4 (ref 0–0.9)
MPV: 7.5 fL (ref 0–99.8)
POC Granulocyte: 4.4 (ref 2–6.9)
POC LYMPH PERCENT: 19.4 %L (ref 10–50)
POC MID %: 6.9 %M (ref 0–12)
Platelet Count, POC: 291 10*3/uL (ref 142–424)
RDW, POC: 12.6 %

## 2012-12-13 LAB — GLUCOSE, POCT (MANUAL RESULT ENTRY): POC Glucose: 199 mg/dl — AB (ref 70–99)

## 2012-12-13 MED ORDER — BENZONATATE 100 MG PO CAPS: 100.0000 mg | ORAL_CAPSULE | Freq: Three times a day (TID) | ORAL | Status: DC | PRN

## 2012-12-13 MED ORDER — OSELTAMIVIR PHOSPHATE 75 MG PO CAPS: 75.0000 mg | ORAL_CAPSULE | Freq: Two times a day (BID) | ORAL | Status: DC

## 2012-12-13 NOTE — Progress Notes (Signed)
[facility] [address], [place] [address] [phone]  Date:  12/13/2012   Name:  Allen Thomas   DOB:  Oct 17, 1966   MRN:  [id number]  PCP:  Mickie Hillier, MD    Chief Complaint: Cough and Fever   History of Present Illness:  Allen Thomas is a 47 y.o. very pleasant male patient who presents with the following:  Here for a recheck- he was here on 12/29 with malaise and cough.  No fever.  He had a negative flu swab and was started on azithromycin and tussionex.  The cough has gotten worse, and his chest hurts with coughing.  Yesterday he coughed so hard that he developed a ST.  He is still coughing a lot at night.  The tussionex helps some but not a lot.    Last night he had a temperature of 101.5- he treated this with advil.    No GI symptoms.  He did have chills and aches last night.    His mother in law was in the ED last night with COPD exacerbation.    He has a history of HTN, anxiety/ depression and glucose intolerance.    Patient Active Problem List  Diagnosis  . Internal derangement of knee joint    Past Medical History  Diagnosis Date  . Hypertension   . Anxiety   . Depression   . Diabetes mellitus     prediabetic   . Sleep apnea     settings at 11     Past Surgical History  Procedure Date  . Back surgery     L5-S1 back surgery   . Knee arthroscopy 07/27/2012    Procedure: ARTHROSCOPY KNEE;  Surgeon: Kathryne Hitch, MD;  Location: [facility];  Service: Orthopedics;  Laterality: Left;  Left knee arthroscopy with debridement and medial menisectomy    History  Substance Use Topics  . Smoking status: Former Games developer  . Smokeless tobacco: Former Neurosurgeon  . Alcohol Use: Yes     Comment: rare    History reviewed. No pertinent family history.  No Known Allergies  Medication list has been reviewed and updated.  Current Outpatient Prescriptions on File Prior to Visit  Medication Sig Dispense Refill  . amLODipine (NORVASC)  10 MG tablet Take 10 mg by mouth every morning.       Marland Kitchen azithromycin (ZITHROMAX) 250 MG tablet Take 2 tabs PO x 1 dose, then 1 tab PO QD x 4 days  6 tablet  0  . chlorpheniramine-HYDROcodone (TUSSIONEX PENNKINETIC ER) 10-8 MG/5ML LQCR Take 5 mLs by mouth every 12 (twelve) hours as needed (cough).  60 mL  0  . Choline Fenofibrate (TRILIPIX) 135 MG capsule Take 135 mg by mouth every evening.       . clidinium-chlordiazePOXIDE (LIBRAX) 2.5-5 MG per capsule Take 1 capsule by mouth 2 (two) times daily.      Marland Kitchen Coal Tar Extract (PSORIASIN) 1.25 % GEL Apply topically as needed. 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 lisinopril (PRINIVIL,ZESTRIL) 10 MG tablet Take 10 mg by mouth every morning.       Marland Kitchen LORazepam (ATIVAN) 0.5 MG tablet Take 0.25 mg by mouth every 6 (six) hours as needed. Anxiety      . omega-3 acid ethyl esters (LOVAZA) 1 G capsule Take 4 g by mouth every evening.       Marland Kitchen PARoxetine (PAXIL) 40 MG tablet Take 40 mg by mouth every evening.       Marland Kitchen  pseudoephedrine-guaifenesin (MUCINEX D) 60-600 MG per tablet Take 1 tablet by mouth every 12 (twelve) hours.  18 tablet  0  . rosuvastatin (CRESTOR) 40 MG tablet Take 40 mg by mouth every evening.       . traMADol (ULTRAM) 50 MG tablet Take 50 mg by mouth every 6 (six) hours as needed. Pain        Review of Systems:  As per HPI- otherwise negative.   Physical Examination: Filed Vitals:   12/13/12 0756  BP: 110/75  Pulse: 114  Temp: 99.7 F (37.6 C)  Resp: 18   Filed Vitals:   12/13/12 0756  Height: 5\' 11"  (1.803 m)  Weight: 227 lb (102.967 kg)   Body mass index is 31.66 kg/(m^2). Ideal Body Weight: Weight in (lb) to have BMI = 25: 178.9   GEN: WDWN, NAD, Non-toxic, A & O x 3, coughing, overweight HEENT: Atraumatic, Normocephalic. Neck supple. No masses, No LAD. Bilateral TM wnl, oropharynx normal.  PEERL,EOMI.   Ears and Nose: No external deformity. CV: RRR, No M/G/R. No JVD. No thrill. No extra heart sounds. PULM: CTA B, no wheezes,  crackles, rhonchi. No retractions. No resp. distress. No accessory muscle use. ABD: S, NT, ND EXTR: No c/c/e NEURO Normal gait.  PSYCH: Normally interactive. Conversant. Not depressed or anxious appearing.  Calm demeanor.   [facility] reading (PRIMARY) by  Dr. Patsy Lager. CXR: possible right sided infiltrate- nothing definite. Elevation right hemidiaphragm.   CHEST - 2 VIEW  Comparison: None.  Findings: Mild hyperinflation. Mild right hemidiaphragm elevation. Midline trachea. Normal heart size and mediastinal contours. No pleural effusion or pneumothorax. Minimal volume loss at the left lung base. Lungs otherwise clear.  IMPRESSION: No acute cardiopulmonary disease.   Results for orders placed in visit on 12/13/12  POCT CBC      Component Value Range   WBC 6.0  4.6 - 10.2 K/uL   Lymph, poc 1.2  0.6 - 3.4   POC LYMPH PERCENT 19.4  10 - 50 %L   MID (cbc) 0.4  0 - 0.9   POC MID % 6.9  0 - 12 %M   POC Granulocyte 4.4  2 - 6.9   Granulocyte percent 73.7  37 - 80 %G   RBC 4.01 (*) 4.69 - 6.13 M/uL   Hemoglobin 11.8 (*) 14.1 - 18.1 g/dL   HCT, POC 08.6 (*) 57.8 - 53.7 %   MCV 90.4  80 - 97 fL   MCH, POC 29.4  27 - 31.2 pg   MCHC 32.5  31.8 - 35.4 g/dL   RDW, POC 46.9     Platelet Count, POC 291  142 - 424 K/uL   MPV 7.5  0 - 99.8 fL  GLUCOSE, POCT (MANUAL RESULT ENTRY)      Component Value Range   POC Glucose 199 (*) 70 - 99 mg/dl    Assessment and Plan: 1. Cough  POCT CBC, POCT glucose (manual entry), DG Chest 2 View, benzonatate (TESSALON) 100 MG capsule  2. Flu syndrome  oseltamivir (TAMIFLU) 75 MG capsule  3. Anemia  POCT CBC   Suspect that Teresa may have the flu after all.  Will add tamiflu to his regimen, as well as tessalon perles.  Continue azithromycin.  Let me know if continuing to get worse or if not better in the next few days  Mild anemia- recheck CBC in 2 or 3 weeks   Goerge Mohr, MD

## 2012-12-13 NOTE — Patient Instructions (Addendum)
Rest and drink plenty of fluids.  I think that you may have the flu.  Continue taking the antibiotic and add the Tamiflu.  Let me know if you are not better in the next few days- Sooner if worse.    In 2 or 3 weeks please come for lab visit only to recheck your blood level

## 2012-12-15 ENCOUNTER — Other Ambulatory Visit: Payer: Self-pay | Admitting: Emergency Medicine

## 2013-01-01 ENCOUNTER — Other Ambulatory Visit: Payer: Self-pay | Admitting: *Deleted

## 2013-03-05 ENCOUNTER — Ambulatory Visit (INDEPENDENT_AMBULATORY_CARE_PROVIDER_SITE_OTHER): Payer: BC Managed Care – PPO | Admitting: Family Medicine

## 2013-03-05 VITALS — BP 126/75 | HR 99 | Temp 98.3°F | Resp 18 | Ht 72.0 in | Wt 220.0 lb

## 2013-03-05 DIAGNOSIS — J209 Acute bronchitis, unspecified: Secondary | ICD-10-CM

## 2013-03-05 DIAGNOSIS — J019 Acute sinusitis, unspecified: Secondary | ICD-10-CM

## 2013-03-05 DIAGNOSIS — R040 Epistaxis: Secondary | ICD-10-CM

## 2013-03-05 LAB — POCT CBC
Granulocyte percent: 62.7 %G (ref 37–80)
HCT, POC: 36.1 % — AB (ref 43.5–53.7)
MCV: 89.2 fL (ref 80–97)
POC Granulocyte: 4.4 (ref 2–6.9)
POC LYMPH PERCENT: 30.9 %L (ref 10–50)
Platelet Count, POC: 340 10*3/uL (ref 142–424)
RBC: 4.05 M/uL — AB (ref 4.69–6.13)
RDW, POC: 13 %

## 2013-03-05 MED ORDER — AZITHROMYCIN 250 MG PO TABS: ORAL_TABLET | ORAL | Status: DC

## 2013-03-05 NOTE — Patient Instructions (Addendum)
You may be ill with a viral infection today.  If you are not better in the next couple of days fill and start the azithromycin prescription.  Let me know if you get worse. You did have a mild anemia today, which may be due to nosebleeds.

## 2013-03-05 NOTE — Progress Notes (Signed)
[facility] [address] [place] [address] [phone]  Date:  03/05/2013   Name:  Allen Thomas   DOB:  04-09-1966   MRN:  [id number]  PCP:  Mickie Hillier, MD    Chief Complaint: Cough, Fever and Headache   History of Present Illness:  Allen Thomas is a 47 y.o. very pleasant male patient who presents with the following:  Today is Monday.  Here today with illness.  On Friday he noted a cough and a headache.  On Saturday he had a low grade fever and cough.  He is treated the cough with tesslon perles, but continues to have a productive cough. He is coughing up green material.  He is also having nose bleeds.   His temperature got up to 100 on Saturday, but this was not persistent. He has not noted severe body aches.   His HA is now better, but he does have sinus pressure and pain.    No GI symptoms.   He did have a scratchy throat, but not a sore throat.  He has some sneezing, and his nose is very dry.  He is using OTC allergy medications.  He has suffered from nosebleeds his whole life and describes frequent bleeds of short duration over the last few days  No antipyretics yet this morning.    His hemoglobin last month at his PCPs office was 13.7  Patient Active Problem List  Diagnosis  . Internal derangement of knee joint    Past Medical History  Diagnosis Date  . Hypertension   . Anxiety   . Depression   . Diabetes mellitus     prediabetic   . Sleep apnea     settings at 11     Past Surgical History  Procedure Laterality Date  . Back surgery      L5-S1 back surgery   . Knee arthroscopy  07/27/2012    Procedure: ARTHROSCOPY KNEE;  Surgeon: Kathryne Hitch, MD;  Location: [facility];  Service: Orthopedics;  Laterality: Left;  Left knee arthroscopy with debridement and medial menisectomy    History  Substance Use Topics  . Smoking status: Former Games developer  . Smokeless tobacco: Former Neurosurgeon  . Alcohol Use: Yes     Comment: rare    No  family history on file.  No Known Allergies  Medication list has been reviewed and updated.  Current Outpatient Prescriptions on File Prior to Visit  Medication Sig Dispense Refill  . amLODipine (NORVASC) 10 MG tablet Take 10 mg by mouth every morning.       . Choline Fenofibrate (TRILIPIX) 135 MG capsule Take 135 mg by mouth every evening.       . clidinium-chlordiazePOXIDE (LIBRAX) 2.5-5 MG per capsule Take 1 capsule by mouth 2 (two) times daily.      Marland Kitchen lisinopril (PRINIVIL,ZESTRIL) 10 MG tablet Take 10 mg by mouth every morning.       Marland Kitchen LORazepam (ATIVAN) 0.5 MG tablet Take 0.25 mg by mouth every 6 (six) hours as needed. Anxiety      . PARoxetine (PAXIL) 40 MG tablet Take 40 mg by mouth every evening.       Marland Kitchen azithromycin (ZITHROMAX) 250 MG tablet Take 2 tabs PO x 1 dose, then 1 tab PO QD x 4 days  6 tablet  0  . benzonatate (TESSALON) 100 MG capsule Take 1 capsule (100 mg total) by mouth 3 (three) times daily as needed for cough.  40 capsule  0  . chlorpheniramine-HYDROcodone (TUSSIONEX PENNKINETIC ER) 10-8 MG/5ML LQCR Take 5 mLs by mouth every 12 (twelve) hours as needed (cough).  60 mL  0  . Coal Tar Extract (PSORIASIN) 1.25 % GEL Apply topically as needed. 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 omega-3 acid ethyl esters (LOVAZA) 1 G capsule Take 4 g by mouth every evening.       Marland Kitchen oseltamivir (TAMIFLU) 75 MG capsule Take 1 capsule (75 mg total) by mouth 2 (two) times daily.  10 capsule  0  . pseudoephedrine-guaifenesin (MUCINEX D) 60-600 MG per tablet Take 1 tablet by mouth every 12 (twelve) hours.  18 tablet  0  . rosuvastatin (CRESTOR) 40 MG tablet Take 40 mg by mouth every evening.       . traMADol (ULTRAM) 50 MG tablet Take 50 mg by mouth every 6 (six) hours as needed. Pain       No current facility-administered medications on file prior to visit.    Review of Systems:  As per HPI- otherwise negative.'  Physical Examination: Filed Vitals:   03/05/13 0855  BP: 126/75  Pulse: 99   Temp: 98.3 F (36.8 C)  Resp: 18   Filed Vitals:   03/05/13 0855  Height: 6' (1.829 m)  Weight: 220 lb (99.791 kg)   Body mass index is 29.83 kg/(m^2). Ideal Body Weight: Weight in (lb) to have BMI = 25: 183.9  GEN: WDWN, NAD, Non-toxic, A & O x 3 HEENT: Atraumatic, Normocephalic. Neck supple. No masses, No LAD.  Bilateral TM wnl, oropharynx normal.  PEERL,EOMI. No active bleeding from nose.  Sinuses are tender to percussion Ears and Nose: No external deformity. CV: RRR, No M/G/R. No JVD. No thrill. No extra heart sounds. PULM: CTA B, no wheezes, crackles, rhonchi. No retractions. No resp. distress. No accessory muscle use. EXTR: No c/c/e NEURO Normal gait.  PSYCH: Normally interactive. Conversant. Not depressed or anxious appearing.  Calm demeanor.   Results for orders placed in visit on 03/05/13  POCT CBC      Result Value Range   WBC 7.0  4.6 - 10.2 K/uL   Lymph, poc 2.2  0.6 - 3.4   POC LYMPH PERCENT 30.9  10 - 50 %L   MID (cbc) 0.4  0 - 0.9   POC MID % 6.4  0 - 12 %M   POC Granulocyte 4.4  2 - 6.9   Granulocyte percent 62.7  37 - 80 %G   RBC 4.05 (*) 4.69 - 6.13 M/uL   Hemoglobin 11.8 (*) 14.1 - 18.1 g/dL   HCT, POC 04.5 (*) 40.9 - 53.7 %   MCV 89.2  80 - 97 fL   MCH, POC 29.1  27 - 31.2 pg   MCHC 32.7  31.8 - 35.4 g/dL   RDW, POC 81.1     Platelet Count, POC 340  142 - 424 K/uL   MPV 7.8  0 - 99.8 fL    Assessment and Plan: Left-sided nosebleed - Plan: POCT CBC  Sinusitis, acute - Plan: POCT CBC, azithromycin (ZITHROMAX) 250 MG tablet  Acute bronchitis - Plan: azithromycin (ZITHROMAX) 250 MG tablet  Recurrent nosebleeds since childhood.  Suspect this is the cause of slightly decreased hemoglobin today- normal hemoglobin at PCP last month.  Will treat with azithromycin as this has helped him in the past.  He will let me know if not better in the next few days- Sooner if worse.  Signed Lamar Blinks, MD

## 2013-03-10 ENCOUNTER — Telehealth: Payer: Self-pay

## 2013-03-10 NOTE — Telephone Encounter (Signed)
SAW COPLAND Monday AND COUGH IS WORSE, HE WAS TAKING TESALON PEARLS AND ITS NOT HELPING. HE IS WANTING SOMETHING STRONGER CALLED IN FOR HIM. PLEASE ADVISE BEST NUMBER IS: [phone]

## 2013-03-12 NOTE — Telephone Encounter (Signed)
Will you please review

## 2013-03-12 NOTE — Telephone Encounter (Signed)
Called him back- he was seen on 3/24 and was treated with azithromycin.  He is actually doing ok now- he does not need any cough medication called in at this time.

## 2013-10-03 ENCOUNTER — Encounter (HOSPITAL_COMMUNITY): Payer: Self-pay | Admitting: Emergency Medicine

## 2013-10-03 ENCOUNTER — Emergency Department (HOSPITAL_COMMUNITY)
Admission: EM | Admit: 2013-10-03 | Discharge: 2013-10-03 | Disposition: A | Discharge status: Home / Self Care | Payer: BC Managed Care – PPO | Attending: Emergency Medicine | Admitting: Emergency Medicine

## 2013-10-03 DIAGNOSIS — K602 Anal fissure, unspecified: Secondary | ICD-10-CM | POA: Insufficient documentation

## 2013-10-03 DIAGNOSIS — I10 Essential (primary) hypertension: Secondary | ICD-10-CM | POA: Insufficient documentation

## 2013-10-03 DIAGNOSIS — F3289 Other specified depressive episodes: Secondary | ICD-10-CM | POA: Insufficient documentation

## 2013-10-03 DIAGNOSIS — Z79899 Other long term (current) drug therapy: Secondary | ICD-10-CM | POA: Insufficient documentation

## 2013-10-03 DIAGNOSIS — F411 Generalized anxiety disorder: Secondary | ICD-10-CM | POA: Insufficient documentation

## 2013-10-03 DIAGNOSIS — L408 Other psoriasis: Secondary | ICD-10-CM | POA: Insufficient documentation

## 2013-10-03 DIAGNOSIS — K625 Hemorrhage of anus and rectum: Secondary | ICD-10-CM

## 2013-10-03 DIAGNOSIS — E119 Type 2 diabetes mellitus without complications: Secondary | ICD-10-CM | POA: Insufficient documentation

## 2013-10-03 DIAGNOSIS — Z87891 Personal history of nicotine dependence: Secondary | ICD-10-CM | POA: Insufficient documentation

## 2013-10-03 DIAGNOSIS — F329 Major depressive disorder, single episode, unspecified: Secondary | ICD-10-CM | POA: Insufficient documentation

## 2013-10-03 DIAGNOSIS — R42 Dizziness and giddiness: Secondary | ICD-10-CM | POA: Insufficient documentation

## 2013-10-03 DIAGNOSIS — R5381 Other malaise: Secondary | ICD-10-CM | POA: Insufficient documentation

## 2013-10-03 LAB — BASIC METABOLIC PANEL
BUN: 11 mg/dL (ref 6–23)
CO2: 25 mEq/L (ref 19–32)
Glucose, Bld: 148 mg/dL — ABNORMAL HIGH (ref 70–99)
Potassium: 3.8 mEq/L (ref 3.5–5.1)
Sodium: 139 mEq/L (ref 135–145)

## 2013-10-03 LAB — CBC WITH DIFFERENTIAL/PLATELET
Hemoglobin: 13.4 g/dL (ref 13.0–17.0)
Lymphocytes Relative: 52 % — ABNORMAL HIGH (ref 12–46)
Lymphs Abs: 3.4 10*3/uL (ref 0.7–4.0)
MCH: 30.3 pg (ref 26.0–34.0)
MCV: 83 fL (ref 78.0–100.0)
Monocytes Relative: 7 % (ref 3–12)
Neutrophils Relative %: 39 % — ABNORMAL LOW (ref 43–77)
Platelets: 297 10*3/uL (ref 150–400)
RBC: 4.42 MIL/uL (ref 4.22–5.81)
WBC: 6.6 10*3/uL (ref 4.0–10.5)

## 2013-10-03 MED ORDER — POLYETHYLENE GLYCOL 3350 17 GM/SCOOP PO POWD: 17.0000 g | Freq: Two times a day (BID) | ORAL | Status: DC

## 2013-10-03 MED ORDER — SODIUM CHLORIDE 0.9 % IV BOLUS (SEPSIS)
1000.0000 mL | Freq: Once | INTRAVENOUS | Status: AC
  Administered 2013-10-03: 1000 mL via INTRAVENOUS

## 2013-10-03 NOTE — ED Notes (Signed)
Per EMS pt coming from work with c/o GI bleed; per EMS pt reports bright red blood from rectum x 3 days multiple episodes, EMS sts pt is weak and lethargic. VSS. PIV established by EMS 4 mg of zofran given as well as 200 ml's NS.

## 2013-10-03 NOTE — ED Provider Notes (Signed)
CSN: [id number]     Arrival date & time 10/03/13  1700 History   First MD Initiated Contact with Patient 10/03/13 1706     Chief Complaint  Patient presents with  . GI Bleeding   (Consider location/radiation/quality/duration/timing/severity/associated sxs/prior Treatment) HPI Comments: Patient presents to the ED with a chief complaint of rectal bleeding.  Patient states that he has been bleeding for the past 3 days when he has a bowel movement.  Patient states that he has a known tear, and that he has psoriasis in the surround area.  Patient states that normally he will stop bleeding after a day or two with increased dietary fiber and stool softeners.  Today, the patient states that he has had increasing fatigue, weakness, and lightheadedness which started today.    The history is provided by the patient. No language interpreter was used.    Past Medical History  Diagnosis Date  . Hypertension   . Anxiety   . Depression   . Diabetes mellitus     prediabetic   . Sleep apnea     settings at 11    Past Surgical History  Procedure Laterality Date  . Back surgery      L5-S1 back surgery   . Knee arthroscopy  07/27/2012    Procedure: ARTHROSCOPY KNEE;  Surgeon: Kathryne Hitch, MD;  Location: [facility];  Service: Orthopedics;  Laterality: Left;  Left knee arthroscopy with debridement and medial menisectomy   History reviewed. No pertinent family history. History  Substance Use Topics  . Smoking status: Former Games developer  . Smokeless tobacco: Former Neurosurgeon  . Alcohol Use: Yes     Comment: rare    Review of Systems  All other systems reviewed and are negative.    Allergies  Review of patient's allergies indicates no known allergies.  Home Medications   Current Outpatient Rx  Name  Route  Sig  Dispense  Refill  . amLODipine (NORVASC) 10 MG tablet   Oral   Take 10 mg by mouth every morning.          Marland Kitchen azithromycin (ZITHROMAX) 250 MG tablet      Take 2 tabs PO x 1 dose,  then 1 tab PO QD x 4 days   6 tablet   0   . benzonatate (TESSALON) 100 MG capsule   Oral   Take 1 capsule (100 mg total) by mouth 3 (three) times daily as needed for cough.   40 capsule   0   . chlorpheniramine-HYDROcodone (TUSSIONEX PENNKINETIC ER) 10-8 MG/5ML LQCR   Oral   Take 5 mLs by mouth every 12 (twelve) hours as needed (cough).   60 mL   0   . Choline Fenofibrate (TRILIPIX) 135 MG capsule   Oral   Take 135 mg by mouth every evening.          . clidinium-chlordiazePOXIDE (LIBRAX) 2.5-5 MG per capsule   Oral   Take 1 capsule by mouth 2 (two) times daily.         Marland Kitchen Coal Tar Extract (PSORIASIN) 1.25 % GEL   Apply externally   Apply topically as needed. 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 lisinopril (PRINIVIL,ZESTRIL) 10 MG tablet   Oral   Take 10 mg by mouth every morning.          Marland Kitchen LORazepam (ATIVAN) 0.5 MG tablet   Oral   Take 0.25 mg by mouth every 6 (six) hours as  needed. Anxiety         . omega-3 acid ethyl esters (LOVAZA) 1 G capsule   Oral   Take 4 g by mouth every evening.          Marland Kitchen oseltamivir (TAMIFLU) 75 MG capsule   Oral   Take 1 capsule (75 mg total) by mouth 2 (two) times daily.   10 capsule   0   . PARoxetine (PAXIL) 40 MG tablet   Oral   Take 40 mg by mouth every evening.          . pseudoephedrine-guaifenesin (MUCINEX D) 60-600 MG per tablet   Oral   Take 1 tablet by mouth every 12 (twelve) hours.   18 tablet   0   . rosuvastatin (CRESTOR) 40 MG tablet   Oral   Take 40 mg by mouth every evening.          . traMADol (ULTRAM) 50 MG tablet   Oral   Take 50 mg by mouth every 6 (six) hours as needed. Pain          BP 126/74  Pulse 68  Temp(Src) 98.5 F (36.9 C) (Oral)  Resp 16  SpO2 99% Physical Exam  Nursing note and vitals reviewed. Constitutional: He is oriented to person, place, and time. He appears well-developed and well-nourished.  HENT:  Head: Normocephalic and atraumatic.  Mouth/Throat: No  oropharyngeal exudate.  Eyes: Conjunctivae and EOM are normal. Pupils are equal, round, and reactive to light. Right eye exhibits no discharge. Left eye exhibits no discharge. No scleral icterus.  Neck: Normal range of motion. Neck supple. No JVD present.  Cardiovascular: Normal rate, regular rhythm, normal heart sounds and intact distal pulses.  Exam reveals no gallop and no friction rub.   No murmur heard. Pulmonary/Chest: Effort normal and breath sounds normal. No respiratory distress. He has no wheezes. He has no rales. He exhibits no tenderness.  Abdominal: Soft. He exhibits no distension and no mass. There is no tenderness. There is no rebound and no guarding.  Genitourinary:  Very small tear at 11:00, does not extend into the rectum, no active bleeding, no hemorrhoids, no masses, no hemorrhage.  Musculoskeletal: Normal range of motion. He exhibits no edema and no tenderness.  Neurological: He is alert and oriented to person, place, and time.  Skin: Skin is warm and dry.  Psychiatric: He has a normal mood and affect. His behavior is normal. Judgment and thought content normal.    ED Course  Procedures (including critical care time) Results for orders placed during the hospital encounter of 10/03/13  CBC WITH DIFFERENTIAL      Result Value Range   WBC 6.6  4.0 - 10.5 K/uL   RBC 4.42  4.22 - 5.81 MIL/uL   Hemoglobin 13.4  13.0 - 17.0 g/dL   HCT 96.0 (*) 45.4 - 09.8 %   MCV 83.0  78.0 - 100.0 fL   MCH 30.3  26.0 - 34.0 pg   MCHC 36.5 (*) 30.0 - 36.0 g/dL   RDW 11.9  14.7 - 82.9 %   Platelets 297  150 - 400 K/uL   Neutrophils Relative % 39 (*) 43 - 77 %   Neutro Abs 2.6  1.7 - 7.7 K/uL   Lymphocytes Relative 52 (*) 12 - 46 %   Lymphs Abs 3.4  0.7 - 4.0 K/uL   Monocytes Relative 7  3 - 12 %   Monocytes Absolute 0.4  0.1 - 1.0 K/uL  Eosinophils Relative 2  0 - 5 %   Eosinophils Absolute 0.1  0.0 - 0.7 K/uL   Basophils Relative 1  0 - 1 %   Basophils Absolute 0.0  0.0 - 0.1 K/uL   BASIC METABOLIC PANEL      Result Value Range   Sodium 139  135 - 145 mEq/L   Potassium 3.8  3.5 - 5.1 mEq/L   Chloride 104  96 - 112 mEq/L   CO2 25  19 - 32 mEq/L   Glucose, Bld 148 (*) 70 - 99 mg/dL   BUN 11  6 - 23 mg/dL   Creatinine, Ser 1.61  0.50 - 1.35 mg/dL   Calcium 9.9  8.4 - 09.6 mg/dL   GFR calc non Af Amer >90  >90 mL/min   GFR calc Af Amer >90  >90 mL/min  OCCULT BLOOD, POC DEVICE      Result Value Range   Fecal Occult Bld POSITIVE (*) NEGATIVE      EKG Interpretation   None       MDM   1. Rectal bleeding     Patient with rectal bleeding.  Suspect that the bleeding is from a small tear.  Will check basic labs and give fluids.  Will re-evaluate.  Labs are reassuring. Will ambulate and check orthostatic vitals signs. If normal, will discharge to home. Will give stool softener.  Patient discussed with Dr. Lynelle Doctor, who agrees with the plan. Filed Vitals:   10/03/13 1923  BP: 119/78  Pulse: 84  Temp:   Resp:       Roxy Horseman, PA-C 10/03/13 1949

## 2013-10-03 NOTE — ED Provider Notes (Signed)
Medical screening examination/treatment/procedure(s) were performed by non-physician practitioner and as supervising physician I was immediately available for consultation/collaboration.  EKG Interpretation   None         Celene Kras, MD 10/03/13 [phone]

## 2013-10-03 NOTE — ED Notes (Signed)
Pt feels like he is unable to walk due to the dizziness and weakness.

## 2013-10-18 ENCOUNTER — Other Ambulatory Visit: Payer: Self-pay

## 2013-12-07 ENCOUNTER — Encounter: Payer: Self-pay | Admitting: General Surgery

## 2013-12-07 DIAGNOSIS — Z8249 Family history of ischemic heart disease and other diseases of the circulatory system: Secondary | ICD-10-CM | POA: Insufficient documentation

## 2013-12-07 DIAGNOSIS — I10 Essential (primary) hypertension: Secondary | ICD-10-CM

## 2013-12-07 DIAGNOSIS — E669 Obesity, unspecified: Secondary | ICD-10-CM | POA: Insufficient documentation

## 2013-12-07 DIAGNOSIS — G4733 Obstructive sleep apnea (adult) (pediatric): Secondary | ICD-10-CM

## 2013-12-20 ENCOUNTER — Ambulatory Visit: Payer: BC Managed Care – PPO | Admitting: Cardiology

## 2014-04-08 ENCOUNTER — Other Ambulatory Visit: Payer: Self-pay | Admitting: General Surgery

## 2014-04-08 ENCOUNTER — Telehealth: Payer: Self-pay | Admitting: *Deleted

## 2014-04-08 MED ORDER — CHOLINE FENOFIBRATE 135 MG PO CPDR: 135.0000 mg | DELAYED_RELEASE_CAPSULE | Freq: Every evening | ORAL | Status: DC

## 2014-04-08 MED ORDER — ROSUVASTATIN CALCIUM 40 MG PO TABS: 40.0000 mg | ORAL_TABLET | Freq: Every evening | ORAL | Status: DC

## 2014-04-08 NOTE — Telephone Encounter (Signed)
Only gave one refill p way overdue to see DR Mayford Knifeurner. He needs a F/U before any other refills

## 2014-04-08 NOTE — Telephone Encounter (Signed)
Patient is out of the crestor and trilipix and would like them sent to [facility] on [address]. Thanks, MI

## 2014-07-25 ENCOUNTER — Emergency Department (HOSPITAL_COMMUNITY): Payer: BC Managed Care – PPO

## 2014-07-25 ENCOUNTER — Emergency Department (HOSPITAL_COMMUNITY)
Admission: EM | Admit: 2014-07-25 | Discharge: 2014-07-25 | Disposition: A | Discharge status: Home / Self Care | Payer: BC Managed Care – PPO | Attending: Emergency Medicine | Admitting: Emergency Medicine

## 2014-07-25 ENCOUNTER — Encounter (HOSPITAL_COMMUNITY): Payer: Self-pay | Admitting: Emergency Medicine

## 2014-07-25 DIAGNOSIS — S298XXA Other specified injuries of thorax, initial encounter: Secondary | ICD-10-CM | POA: Diagnosis not present

## 2014-07-25 DIAGNOSIS — Z87891 Personal history of nicotine dependence: Secondary | ICD-10-CM | POA: Diagnosis not present

## 2014-07-25 DIAGNOSIS — Y9316 Activity, rowing, canoeing, kayaking, rafting and tubing: Secondary | ICD-10-CM | POA: Insufficient documentation

## 2014-07-25 DIAGNOSIS — Y9289 Other specified places as the place of occurrence of the external cause: Secondary | ICD-10-CM | POA: Insufficient documentation

## 2014-07-25 DIAGNOSIS — W1809XA Striking against other object with subsequent fall, initial encounter: Secondary | ICD-10-CM | POA: Insufficient documentation

## 2014-07-25 DIAGNOSIS — E119 Type 2 diabetes mellitus without complications: Secondary | ICD-10-CM | POA: Insufficient documentation

## 2014-07-25 DIAGNOSIS — S0990XA Unspecified injury of head, initial encounter: Secondary | ICD-10-CM

## 2014-07-25 DIAGNOSIS — R0789 Other chest pain: Secondary | ICD-10-CM

## 2014-07-25 DIAGNOSIS — Z79899 Other long term (current) drug therapy: Secondary | ICD-10-CM | POA: Diagnosis not present

## 2014-07-25 DIAGNOSIS — F329 Major depressive disorder, single episode, unspecified: Secondary | ICD-10-CM | POA: Insufficient documentation

## 2014-07-25 DIAGNOSIS — F3289 Other specified depressive episodes: Secondary | ICD-10-CM | POA: Insufficient documentation

## 2014-07-25 DIAGNOSIS — F411 Generalized anxiety disorder: Secondary | ICD-10-CM | POA: Insufficient documentation

## 2014-07-25 DIAGNOSIS — S060X0A Concussion without loss of consciousness, initial encounter: Secondary | ICD-10-CM | POA: Insufficient documentation

## 2014-07-25 DIAGNOSIS — I10 Essential (primary) hypertension: Secondary | ICD-10-CM | POA: Insufficient documentation

## 2014-07-25 DIAGNOSIS — Z8669 Personal history of other diseases of the nervous system and sense organs: Secondary | ICD-10-CM | POA: Insufficient documentation

## 2014-07-25 DIAGNOSIS — IMO0002 Reserved for concepts with insufficient information to code with codable children: Secondary | ICD-10-CM | POA: Insufficient documentation

## 2014-07-25 DIAGNOSIS — S139XXA Sprain of joints and ligaments of unspecified parts of neck, initial encounter: Secondary | ICD-10-CM | POA: Insufficient documentation

## 2014-07-25 DIAGNOSIS — Z872 Personal history of diseases of the skin and subcutaneous tissue: Secondary | ICD-10-CM | POA: Diagnosis not present

## 2014-07-25 DIAGNOSIS — E669 Obesity, unspecified: Secondary | ICD-10-CM | POA: Diagnosis not present

## 2014-07-25 DIAGNOSIS — S161XXA Strain of muscle, fascia and tendon at neck level, initial encounter: Secondary | ICD-10-CM

## 2014-07-25 DIAGNOSIS — E782 Mixed hyperlipidemia: Secondary | ICD-10-CM | POA: Insufficient documentation

## 2014-07-25 MED ORDER — MORPHINE SULFATE 4 MG/ML IJ SOLN
4.0000 mg | Freq: Once | INTRAMUSCULAR | Status: DC
  Filled 2014-07-25: qty 1

## 2014-07-25 MED ORDER — HYDROCODONE-ACETAMINOPHEN 5-325 MG PO TABS
2.0000 | ORAL_TABLET | Freq: Once | ORAL | Status: AC
  Administered 2014-07-25: 2 via ORAL
  Filled 2014-07-25: qty 2

## 2014-07-25 MED ORDER — HYDROCODONE-ACETAMINOPHEN 5-325 MG PO TABS: 1.0000 | ORAL_TABLET | Freq: Four times a day (QID) | ORAL | Status: DC | PRN

## 2014-07-25 NOTE — Discharge Instructions (Signed)
Concussion A concussion, or closed-head injury, is a brain injury caused by a direct blow to the head or by a quick and sudden movement (jolt) of the head or neck. Concussions are usually not life-threatening. Even so, the effects of a concussion can be serious. If you have had a concussion before, you are more likely to experience concussion-like symptoms after a direct blow to the head.  CAUSES  Direct blow to the head, such as from running into another player during a soccer game, being hit in a fight, or hitting your head on a hard surface.  A jolt of the head or neck that causes the brain to move back and forth inside the skull, such as in a car crash. SIGNS AND SYMPTOMS The signs of a concussion can be hard to notice. Early on, they may be missed by you, family members, and health care providers. You may look fine but act or feel differently. Symptoms are usually temporary, but they may last for days, weeks, or even longer. Some symptoms may appear right away while others may not show up for hours or days. Every head injury is different. Symptoms include:  Mild to moderate headaches that will not go away.  A feeling of pressure inside your head.  Having more trouble than usual:  Learning or remembering things you have heard.  Answering questions.  Paying attention or concentrating.  Organizing daily tasks.  Making decisions and solving problems.  Slowness in thinking, acting or reacting, speaking, or reading.  Getting lost or being easily confused.  Feeling tired all the time or lacking energy (fatigued).  Feeling drowsy.  Sleep disturbances.  Sleeping more than usual.  Sleeping less than usual.  Trouble falling asleep.  Trouble sleeping (insomnia).  Loss of balance or feeling lightheaded or dizzy.  Nausea or vomiting.  Numbness or tingling.  Increased sensitivity to:  Sounds.  Lights.  Distractions.  Vision problems or eyes that tire  easily.  Diminished sense of taste or smell.  Ringing in the ears.  Mood changes such as feeling sad or anxious.  Becoming easily irritated or angry for little or no reason.  Lack of motivation.  Seeing or hearing things other people do not see or hear (hallucinations). DIAGNOSIS Your health care provider can usually diagnose a concussion based on a description of your injury and symptoms. He or she will ask whether you passed out (lost consciousness) and whether you are having trouble remembering events that happened right before and during your injury. Your evaluation might include:  A brain scan to look for signs of injury to the brain. Even if the test shows no injury, you may still have a concussion.  Blood tests to be sure other problems are not present. TREATMENT  Concussions are usually treated in an emergency department, in urgent care, or at a clinic. You may need to stay in the hospital overnight for further treatment.  Tell your health care provider if you are taking any medicines, including prescription medicines, over-the-counter medicines, and natural remedies. Some medicines, such as blood thinners (anticoagulants) and aspirin, may increase the chance of complications. Also tell your health care provider whether you have had alcohol or are taking illegal drugs. This information may affect treatment.  Your health care provider will send you home with important instructions to follow.  How fast you will recover from a concussion depends on many factors. These factors include how severe your concussion is, what part of your brain was injured, your  age, and how healthy you were before the concussion. °· Most people with mild injuries recover fully. Recovery can take time. In general, recovery is slower in older persons. Also, persons who have had a concussion in the past or have other medical problems may find that it takes longer to recover from their current injury. °HOME  CARE INSTRUCTIONS °General Instructions °· Carefully follow the directions your health care provider gave you. °· Only take over-the-counter or prescription medicines for pain, discomfort, or fever as directed by your health care provider. °· Take only those medicines that your health care provider has approved. °· Do not drink alcohol until your health care provider says you are well enough to do so. Alcohol and certain other drugs may slow your recovery and can put you at risk of further injury. °· If it is harder than usual to remember things, write them down. °· If you are easily distracted, try to do one thing at a time. For example, do not try to watch TV while fixing dinner. °· Talk with family members or close friends when making important decisions. °· Keep all follow-up appointments. Repeated evaluation of your symptoms is recommended for your recovery. °· Watch your symptoms and tell others to do the same. Complications sometimes occur after a concussion. Older adults with a brain injury may have a higher risk of serious complications, such as a blood clot on the brain. °· Tell your teachers, school nurse, school counselor, coach, athletic trainer, or work manager about your injury, symptoms, and restrictions. Tell them about what you can or cannot do. They should watch for: °¨ Increased problems with attention or concentration. °¨ Increased difficulty remembering or learning new information. °¨ Increased time needed to complete tasks or assignments. °¨ Increased irritability or decreased ability to cope with stress. °¨ Increased symptoms. °· Rest. Rest helps the brain to heal. Make sure you: °¨ Get plenty of sleep at night. Avoid staying up late at night. °¨ Keep the same bedtime hours on weekends and weekdays. °¨ Rest during the day. Take daytime naps or rest breaks when you feel tired. °· Limit activities that require a lot of thought or concentration. These include: °¨ Doing homework or job-related  work. °¨ Watching TV. °¨ Working on the computer. °· Avoid any situation where there is potential for another head injury (football, hockey, soccer, basketball, martial arts, downhill snow sports and horseback riding). Your condition will get worse every time you experience a concussion. You should avoid these activities until you are evaluated by the appropriate follow-up health care providers. °Returning To Your Regular Activities °You will need to return to your normal activities slowly, not all at once. You must give your body and brain enough time for recovery. °· Do not return to sports or other athletic activities until your health care provider tells you it is safe to do so. °· Ask your health care provider when you can drive, ride a bicycle, or operate heavy machinery. Your ability to react may be slower after a brain injury. Never do these activities if you are dizzy. °· Ask your health care provider about when you can return to work or school. °Preventing Another Concussion °It is very important to avoid another brain injury, especially before you have recovered. In rare cases, another injury can lead to permanent brain damage, brain swelling, or death. The risk of this is greatest during the first 7-10 days after a head injury. Avoid injuries by: °· Wearing a seat   belt when riding in a car.  Drinking alcohol only in moderation.  Wearing a helmet when biking, skiing, skateboarding, skating, or doing similar activities.  Avoiding activities that could lead to a second concussion, such as contact or recreational sports, until your health care provider says it is okay.  Taking safety measures in your home.  Remove clutter and tripping hazards from floors and stairways.  Use grab bars in bathrooms and handrails by stairs.  Place non-slip mats on floors and in bathtubs.  Improve lighting in dim areas. SEEK MEDICAL CARE IF:  You have increased problems paying attention or  concentrating.  You have increased difficulty remembering or learning new information.  You need more time to complete tasks or assignments than before.  You have increased irritability or decreased ability to cope with stress.  You have more symptoms than before. Seek medical care if you have any of the following symptoms for more than 2 weeks after your injury:  Lasting (chronic) headaches.  Dizziness or balance problems.  Nausea.  Vision problems.  Increased sensitivity to noise or light.  Depression or mood swings.  Anxiety or irritability.  Memory problems.  Difficulty concentrating or paying attention.  Sleep problems.  Feeling tired all the time. SEEK IMMEDIATE MEDICAL CARE IF:  You have severe or worsening headaches. These may be a sign of a blood clot in the brain.  You have weakness (even if only in one hand, leg, or part of the face).  You have numbness.  You have decreased coordination.  You vomit repeatedly.  You have increased sleepiness.  One pupil is larger than the other.  You have convulsions.  You have slurred speech.  You have increased confusion. This may be a sign of a blood clot in the brain.  You have increased restlessness, agitation, or irritability.  You are unable to recognize people or places.  You have neck pain.  It is difficult to wake you up.  You have unusual behavior changes.  You lose consciousness. MAKE SURE YOU:  Understand these instructions.  Will watch your condition.  Will get help right away if you are not doing well or get worse. Document Released: 02/19/2004 Document Revised: 12/04/2013 Document Reviewed: 06/21/2013 [facility] Patient Information 2015 [place], [place]. This information is not intended to replace advice given to you by your health care provider. Make sure you discuss any questions you have with your health care provider.   Cervical Sprain A cervical sprain is an injury in the neck in  which the strong, fibrous tissues (ligaments) that connect your neck bones stretch or tear. Cervical sprains can range from mild to severe. Severe cervical sprains can cause the neck vertebrae to be unstable. This can lead to damage of the spinal cord and can result in serious nervous system problems. The amount of time it takes for a cervical sprain to get better depends on the cause and extent of the injury. Most cervical sprains heal in 1 to 3 weeks. CAUSES  Severe cervical sprains may be caused by:   Contact sport injuries (such as from football, rugby, wrestling, hockey, auto racing, gymnastics, diving, martial arts, or boxing).   Motor vehicle collisions.   Whiplash injuries. This is an injury from a sudden forward and backward whipping movement of the head and neck.  Falls.  Mild cervical sprains may be caused by:   Being in an awkward position, such as while cradling a telephone between your ear and shoulder.   Sitting in  a chair that does not offer proper support.   Working at a poorly Marketing executive station.   Looking up or down for long periods of time.  SYMPTOMS   Pain, soreness, stiffness, or a burning sensation in the front, back, or sides of the neck. This discomfort may develop immediately after the injury or slowly, 24 hours or more after the injury.   Pain or tenderness directly in the middle of the back of the neck.   Shoulder or upper back pain.   Limited ability to move the neck.   Headache.   Dizziness.   Weakness, numbness, or tingling in the hands or arms.   Muscle spasms.   Difficulty swallowing or chewing.   Tenderness and swelling of the neck.  DIAGNOSIS  Most of the time your health care provider can diagnose a cervical sprain by taking your history and doing a physical exam. Your health care provider will ask about previous neck injuries and any known neck problems, such as arthritis in the neck. X-rays may be taken to find  out if there are any other problems, such as with the bones of the neck. Other tests, such as a CT scan or MRI, may also be needed.  TREATMENT  Treatment depends on the severity of the cervical sprain. Mild sprains can be treated with rest, keeping the neck in place (immobilization), and pain medicines. Severe cervical sprains are immediately immobilized. Further treatment is done to help with pain, muscle spasms, and other symptoms and may include:  Medicines, such as pain relievers, numbing medicines, or muscle relaxants.   Physical therapy. This may involve stretching exercises, strengthening exercises, and posture training. Exercises and improved posture can help stabilize the neck, strengthen muscles, and help stop symptoms from returning.  HOME CARE INSTRUCTIONS   Put ice on the injured area.   Put ice in a plastic bag.   Place a towel between your skin and the bag.   Leave the ice on for 15-20 minutes, 3-4 times a day.   If your injury was severe, you may have been given a cervical collar to wear. A cervical collar is a two-piece collar designed to keep your neck from moving while it heals.  Do not remove the collar unless instructed by your health care provider.  If you have long hair, keep it outside of the collar.  Ask your health care provider before making any adjustments to your collar. Minor adjustments may be required over time to improve comfort and reduce pressure on your chin or on the back of your head.  Ifyou are allowed to remove the collar for cleaning or bathing, follow your health care provider's instructions on how to do so safely.  Keep your collar clean by wiping it with mild soap and water and drying it completely. If the collar you have been given includes removable pads, remove them every 1-2 days and hand wash them with soap and water. Allow them to air dry. They should be completely dry before you wear them in the collar.  If you are allowed to  remove the collar for cleaning and bathing, wash and dry the skin of your neck. Check your skin for irritation or sores. If you see any, tell your health care provider.  Do not drive while wearing the collar.   Only take over-the-counter or prescription medicines for pain, discomfort, or fever as directed by your health care provider.   Keep all follow-up appointments as directed by your health  care provider.   Keep all physical therapy appointments as directed by your health care provider.   Make any needed adjustments to your workstation to promote good posture.   Avoid positions and activities that make your symptoms worse.   Warm up and stretch before being active to help prevent problems.  SEEK MEDICAL CARE IF:   Your pain is not controlled with medicine.   You are unable to decrease your pain medicine over time as planned.   Your activity level is not improving as expected.  SEEK IMMEDIATE MEDICAL CARE IF:   You develop any bleeding.  You develop stomach upset.  You have signs of an allergic reaction to your medicine.   Your symptoms get worse.   You develop new, unexplained symptoms.   You have numbness, tingling, weakness, or paralysis in any part of your body.  MAKE SURE YOU:   Understand these instructions.  Will watch your condition.  Will get help right away if you are not doing well or get worse. Document Released: 09/26/2007 Document Revised: 12/04/2013 Document Reviewed: 06/06/2013 [facility] Patient Information 2015 [place], [place]. This information is not intended to replace advice given to you by your health care provider. Make sure you discuss any questions you have with your health care provider.  Blunt Trauma You have been evaluated for injuries. You have been examined and your caregiver has not found injuries serious enough to require hospitalization. It is common to have multiple bruises and sore muscles following an accident. These  tend to feel worse for the first 24 hours. You will feel more stiffness and soreness over the next several hours and worse when you wake up the first morning after your accident. After this point, you should begin to improve with each passing day. The amount of improvement depends on the amount of damage done in the accident. Following your accident, if some part of your body does not work as it should, or if the pain in any area continues to increase, you should return to the Emergency Department for re-evaluation.  HOME CARE INSTRUCTIONS  Routine care for sore areas should include:  Ice to sore areas every 2 hours for 20 minutes while awake for the next 2 days.  Drink extra fluids (not alcohol).  Take a hot or warm shower or bath once or twice a day to increase blood flow to sore muscles. This will help you "limber up".  Activity as tolerated. Lifting may aggravate neck or back pain.  Only take over-the-counter or prescription medicines for pain, discomfort, or fever as directed by your caregiver. Do not use aspirin. This may increase bruising or increase bleeding if there are small areas where this is happening. SEEK IMMEDIATE MEDICAL CARE IF:  Numbness, tingling, weakness, or problem with the use of your arms or legs.  A severe headache is not relieved with medications.  There is a change in bowel or bladder control.  Increasing pain in any areas of the body.  Short of breath or dizzy.  Nauseated, vomiting, or sweating.  Increasing belly (abdominal) discomfort.  Blood in urine, stool, or vomiting blood.  Pain in either shoulder in an area where a shoulder strap would be.  Feelings of lightheadedness or if you have a fainting episode. Sometimes it is not possible to identify all injuries immediately after the trauma. It is important that you continue to monitor your condition after the emergency department visit. If you feel you are not improving, or improving more slowly than  should be expected, call your physician. If you feel your symptoms (problems) are worsening, return to the Emergency Department immediately. Document Released: 08/25/2001 Document Revised: 02/21/2012 Document Reviewed: 07/17/2008 [facility] Patient Information 2015 [place], [place]. This information is not intended to replace advice given to you by your health care provider. Make sure you discuss any questions you have with your health care provider.
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
## 2014-07-25 NOTE — ED Notes (Signed)
Pt ambulated in hall without any difficulty.

## 2014-07-25 NOTE — ED Notes (Signed)
Removed C collar per order. Gave pt water and tolerated well.

## 2014-09-05 ENCOUNTER — Ambulatory Visit (INDEPENDENT_AMBULATORY_CARE_PROVIDER_SITE_OTHER): Payer: BC Managed Care – PPO | Admitting: Family Medicine

## 2014-09-05 VITALS — BP 114/74 | HR 92 | Temp 97.6°F | Resp 18 | Ht 72.0 in | Wt 220.6 lb

## 2014-09-05 DIAGNOSIS — E119 Type 2 diabetes mellitus without complications: Secondary | ICD-10-CM

## 2014-09-05 DIAGNOSIS — J3489 Other specified disorders of nose and nasal sinuses: Secondary | ICD-10-CM

## 2014-09-05 DIAGNOSIS — J309 Allergic rhinitis, unspecified: Secondary | ICD-10-CM

## 2014-09-05 MED ORDER — FLUTICASONE PROPIONATE 50 MCG/ACT NA SUSP: 2.0000 | Freq: Every day | NASAL | Status: DC

## 2014-09-05 MED ORDER — AMOXICILLIN-POT CLAVULANATE 875-125 MG PO TABS: 1.0000 | ORAL_TABLET | Freq: Two times a day (BID) | ORAL | Status: DC

## 2014-09-05 NOTE — Progress Notes (Signed)
Subjective:    Patient ID: Allen Thomas, male    DOB: 1966-09-29, 48 y.o.   MRN: [id number]  HPI Allen Thomas is a 48 y.o. male  Sinus pressure earlier in the week, initially in the cheek and jaw area. Had dental work done 3 weeks ago - no teeth problems since, only initial soreness in spacer. Pressure spread to other sinuses - upper face and R cheek, since last night - more pain today in forehead, cheeks and around eyes. No visual changes. Ears feel full, hearing ok. Hx of chronic tinnitus.  No fever, just felt subjectively cold/chills at work. Rare seasonal allergies - not usually. occasional cough. Some PND/drainage, with raspy feeling from drainage, but no purulent nasal discharge. Last sinus infection last winter. No abx in past month. Has had diarrhea with augmentin in past, but tolerated otherwise.  Has tolerated amoxiciilin and Z pak. Has had more sneezing recently, some burning in eyes and watering on the right.   Hx of DM2 - recent blood sugars have been elevated - 212 this morning.  In 100's yesterday. .- recent change in meds for improved control. Followed by Dr. Clarene Duke.   Daughter recently treated for sinus infection.   Tx: advil for headache. claritin earlier this week - no relief.       Patient Active Problem List   Diagnosis Date Noted  . Obstructive sleep apnea (adult) (pediatric) 12/07/2013  . Essential hypertension, benign 12/07/2013  . Obesity (BMI 30-39.9) 12/07/2013  . Family history of early CAD 12/07/2013  . Internal derangement of knee joint 07/27/2012   Past Medical History  Diagnosis Date  . Hypertension   . Anxiety   . Depression   . Sleep apnea     settings at 11   . Mixed dyslipidemia   . Obesity   . Elevated LFTs     Secondary to fatty liver  . History of tobacco abuse   . Tinnitus 2009    followed by ENT Dr Pollyann Kennedy  . Psoriasis     followed by Dermatologist Dr Yetta Barre  . Diabetes mellitus     Type II   Past Surgical History  Procedure  Laterality Date  . Back surgery      L5-S1 back surgery   . Knee arthroscopy  07/27/2012    Procedure: ARTHROSCOPY KNEE;  Surgeon: Kathryne Hitch, MD;  Location: [facility];  Service: Orthopedics;  Laterality: Left;  Left knee arthroscopy with debridement and medial menisectomy  . Cardiac catheterization  2004    normal coronary arteries   No Known Allergies Prior to Admission medications   Medication Sig Start Date End Date Taking? Authorizing Provider  amLODipine (NORVASC) 10 MG tablet Take 10 mg by mouth every morning.    Yes Historical Provider, MD  betamethasone dipropionate (DIPROLENE) 0.05 % ointment Apply 1 application topically daily.   Yes Historical Provider, MD  Choline Fenofibrate (TRILIPIX) 135 MG capsule Take 135 mg by mouth every evening.   Yes Historical Provider, MD  clidinium-chlordiazePOXIDE (LIBRAX) 2.5-5 MG per capsule Take 1 capsule by mouth 2 (two) times daily.   Yes Historical Provider, MD  [place] Extract (PSORIASIN) 1.25 % GEL Apply topically as needed. For psoriasis on elbows   Yes Historical Provider, MD  glimepiride (AMARYL) 1 MG tablet Take 1 mg by mouth daily with breakfast.   Yes Historical Provider, MD  HYDROcodone-acetaminophen (NORCO) 5-325 MG per tablet Take 1 tablet by mouth every 6 (six) hours as needed. 07/25/14  Yes Toy Cookey, MD  lisinopril (PRINIVIL,ZESTRIL) 10 MG tablet Take 10 mg by mouth every morning.    Yes Historical Provider, MD  LORazepam (ATIVAN) 0.5 MG tablet Take 0.25 mg by mouth every 6 (six) hours as needed. Anxiety   Yes Historical Provider, MD  metFORMIN (GLUCOPHAGE) 500 MG tablet Take 500 mg by mouth daily.    Yes Historical Provider, MD  omega-3 acid ethyl esters (LOVAZA) 1 G capsule Take 4 g by mouth every evening.    Yes Historical Provider, MD  PARoxetine (PAXIL) 40 MG tablet Take 40 mg by mouth every evening.    Yes Historical Provider, MD  rosuvastatin (CRESTOR) 40 MG tablet Take 40 mg by mouth every evening.   Yes  Historical Provider, MD   History   Social History  . Marital Status: Married    Spouse Name: N/A    Number of Children: N/A  . Years of Education: N/A   Occupational History  . Not on file.   Social History Main Topics  . Smoking status: Former Smoker    Quit date: 12/14/2003  . Smokeless tobacco: Former Neurosurgeon  . Alcohol Use: Yes     Comment: rare  . Drug Use: No  . Sexual Activity: Not on file   Other Topics Concern  . Not on file   Social History Narrative  . No narrative on file     Review of Systems  Constitutional: Positive for chills. Negative for fever.  HENT: Positive for sinus pressure, sneezing and voice change. Negative for sore throat and trouble swallowing.   Respiratory: Negative for shortness of breath.   Neurological: Positive for headaches.       Objective:   Physical Exam  Vitals reviewed. Constitutional: He is oriented to person, place, and time. He appears well-developed and well-nourished.  HENT:  Head: Normocephalic and atraumatic.  Right Ear: Tympanic membrane, external ear and ear canal normal.  Left Ear: Tympanic membrane, external ear and ear canal normal.  Nose: No rhinorrhea.  Mouth/Throat: Oropharynx is clear and moist and mucous membranes are normal. No oropharyngeal exudate or posterior oropharyngeal erythema.  Eyes: Conjunctivae are normal. Pupils are equal, round, and reactive to light.  Neck: Neck supple.  Cardiovascular: Normal rate, regular rhythm, normal heart sounds and intact distal pulses.   No murmur heard. Pulmonary/Chest: Effort normal and breath sounds normal. He has no wheezes. He has no rhonchi. He has no rales.  Abdominal: Soft. There is no tenderness.  Lymphadenopathy:    He has no cervical adenopathy.  Neurological: He is alert and oriented to person, place, and time.  Skin: Skin is warm and dry. No rash noted.  Psychiatric: He has a normal mood and affect. His behavior is normal.   Filed Vitals:   09/05/14  1323  BP: 114/74  Pulse: 92  Temp: 97.6 F (36.4 C)  TempSrc: Oral  Resp: 18  Height: 6' (1.829 m)  Weight: 220 lb 9.6 oz (100.064 kg)  SpO2: 97%       Assessment & Plan:   Allen Thomas is a 48 y.o. male Sinus pressure - Plan: amoxicillin-clavulanate (AUGMENTIN) 875-125 MG per tablet  Allergic rhinitis, unspecified allergic rhinitis type - Plan: fluticasone (FLONASE) 50 MCG/ACT nasal spray  Type 2 diabetes mellitus without complication  Suspected allergic vs viral sinus congestion.  DDx includes early sinusitis.   -start saline ns, afrin for 1-2 days if needed to relieve pressure - watch BP and avoid use more than few days.   -  Rx for Augmentin given if not improving, but SED. Discussed Z pak, but not ideal if true sinus infection. RTC precautions given.   Meds ordered this encounter  Medications  . glimepiride (AMARYL) 1 MG tablet    Sig: Take 1 mg by mouth daily with breakfast.  . amoxicillin-clavulanate (AUGMENTIN) 875-125 MG per tablet    Sig: Take 1 tablet by mouth 2 (two) times daily.    Dispense:  20 tablet    Refill:  0  . fluticasone (FLONASE) 50 MCG/ACT nasal spray    Sig: Place 2 sprays into both nostrils daily.    Dispense:  16 g    Refill:  2   Patient Instructions  Start flonase, zyrtec or allegra for allergy component. Saline nasal spray at least 4-5 times per day. For the pressure you can use afrin over the counter for 1-2 days, but watch blood pressure while taking this. If symptoms not improving in next few days, can start augmentin for possible sinus infection, but does not appear to be bacterial infection at this point. Return to the clinic or go to the nearest emergency room if any of your symptoms worsen or new symptoms occur.  Sinusitis Sinusitis is redness, soreness, and inflammation of the paranasal sinuses. Paranasal sinuses are air pockets within the bones of your face (beneath the eyes, the middle of the forehead, or above the eyes). In healthy  paranasal sinuses, mucus is able to drain out, and air is able to circulate through them by way of your nose. However, when your paranasal sinuses are inflamed, mucus and air can become trapped. This can allow bacteria and other germs to grow and cause infection. Sinusitis can develop quickly and last only a short time (acute) or continue over a long period (chronic). Sinusitis that lasts for more than 12 weeks is considered chronic.  CAUSES  Causes of sinusitis include:  Allergies.  Structural abnormalities, such as displacement of the cartilage that separates your nostrils (deviated septum), which can decrease the air flow through your nose and sinuses and affect sinus drainage.  Functional abnormalities, such as when the small hairs (cilia) that line your sinuses and help remove mucus do not work properly or are not present. SIGNS AND SYMPTOMS  Symptoms of acute and chronic sinusitis are the same. The primary symptoms are pain and pressure around the affected sinuses. Other symptoms include:  Upper toothache.  Earache.  Headache.  Bad breath.  Decreased sense of smell and taste.  A cough, which worsens when you are lying flat.  Fatigue.  Fever.  Thick drainage from your nose, which often is green and may contain pus (purulent).  Swelling and warmth over the affected sinuses. DIAGNOSIS  Your health care provider will perform a physical exam. During the exam, your health care provider may:  Look in your nose for signs of abnormal growths in your nostrils (nasal polyps).  Tap over the affected sinus to check for signs of infection.  View the inside of your sinuses (endoscopy) using an imaging device that has a light attached (endoscope). If your health care provider suspects that you have chronic sinusitis, one or more of the following tests may be recommended:  Allergy tests.  Nasal culture. A sample of mucus is taken from your nose, sent to a lab, and screened for  bacteria.  Nasal cytology. A sample of mucus is taken from your nose and examined by your health care provider to determine if your sinusitis is related to an  allergy. TREATMENT  Most cases of acute sinusitis are related to a viral infection and will resolve on their own within 10 days. Sometimes medicines are prescribed to help relieve symptoms (pain medicine, decongestants, nasal steroid sprays, or saline sprays).  However, for sinusitis related to a bacterial infection, your health care provider will prescribe antibiotic medicines. These are medicines that will help kill the bacteria causing the infection.  Rarely, sinusitis is caused by a fungal infection. In theses cases, your health care provider will prescribe antifungal medicine. For some cases of chronic sinusitis, surgery is needed. Generally, these are cases in which sinusitis recurs more than 3 times per year, despite other treatments. HOME CARE INSTRUCTIONS   Drink plenty of water. Water helps thin the mucus so your sinuses can drain more easily.  Use a humidifier.  Inhale steam 3 to 4 times a day (for example, sit in the bathroom with the shower running).  Apply a warm, moist washcloth to your face 3 to 4 times a day, or as directed by your health care provider.  Use saline nasal sprays to help moisten and clean your sinuses.  Take medicines only as directed by your health care provider.  If you were prescribed either an antibiotic or antifungal medicine, finish it all even if you start to feel better. SEEK IMMEDIATE MEDICAL CARE IF:  You have increasing pain or severe headaches.  You have nausea, vomiting, or drowsiness.  You have swelling around your face.  You have vision problems.  You have a stiff neck.  You have difficulty breathing. MAKE SURE YOU:   Understand these instructions.  Will watch your condition.  Will get help right away if you are not doing well or get worse. Document Released: 11/29/2005  Document Revised: 04/15/2014 Document Reviewed: 12/14/2011 [facility] Patient Information 2015 [place], [place]. This information is not intended to replace advice given to you by your health care provider. Make sure you discuss any questions you have with your health care provider.

## 2014-09-05 NOTE — Patient Instructions (Signed)
Start flonase, zyrtec or allegra for allergy component. Saline nasal spray at least 4-5 times per day. For the pressure you can use afrin over the counter for 1-2 days, but watch blood pressure while taking this. If symptoms not improving in next few days, can start augmentin for possible sinus infection, but does not appear to be bacterial infection at this point. Return to the clinic or go to the nearest emergency room if any of your symptoms worsen or new symptoms occur.  Sinusitis Sinusitis is redness, soreness, and inflammation of the paranasal sinuses. Paranasal sinuses are air pockets within the bones of your face (beneath the eyes, the middle of the forehead, or above the eyes). In healthy paranasal sinuses, mucus is able to drain out, and air is able to circulate through them by way of your nose. However, when your paranasal sinuses are inflamed, mucus and air can become trapped. This can allow bacteria and other germs to grow and cause infection. Sinusitis can develop quickly and last only a short time (acute) or continue over a long period (chronic). Sinusitis that lasts for more than 12 weeks is considered chronic.  CAUSES  Causes of sinusitis include:  Allergies.  Structural abnormalities, such as displacement of the cartilage that separates your nostrils (deviated septum), which can decrease the air flow through your nose and sinuses and affect sinus drainage.  Functional abnormalities, such as when the small hairs (cilia) that line your sinuses and help remove mucus do not work properly or are not present. SIGNS AND SYMPTOMS  Symptoms of acute and chronic sinusitis are the same. The primary symptoms are pain and pressure around the affected sinuses. Other symptoms include:  Upper toothache.  Earache.  Headache.  Bad breath.  Decreased sense of smell and taste.  A cough, which worsens when you are lying flat.  Fatigue.  Fever.  Thick drainage from your nose, which often  is green and may contain pus (purulent).  Swelling and warmth over the affected sinuses. DIAGNOSIS  Your health care provider will perform a physical exam. During the exam, your health care provider may:  Look in your nose for signs of abnormal growths in your nostrils (nasal polyps).  Tap over the affected sinus to check for signs of infection.  View the inside of your sinuses (endoscopy) using an imaging device that has a light attached (endoscope). If your health care provider suspects that you have chronic sinusitis, one or more of the following tests may be recommended:  Allergy tests.  Nasal culture. A sample of mucus is taken from your nose, sent to a lab, and screened for bacteria.  Nasal cytology. A sample of mucus is taken from your nose and examined by your health care provider to determine if your sinusitis is related to an allergy. TREATMENT  Most cases of acute sinusitis are related to a viral infection and will resolve on their own within 10 days. Sometimes medicines are prescribed to help relieve symptoms (pain medicine, decongestants, nasal steroid sprays, or saline sprays).  However, for sinusitis related to a bacterial infection, your health care provider will prescribe antibiotic medicines. These are medicines that will help kill the bacteria causing the infection.  Rarely, sinusitis is caused by a fungal infection. In theses cases, your health care provider will prescribe antifungal medicine. For some cases of chronic sinusitis, surgery is needed. Generally, these are cases in which sinusitis recurs more than 3 times per year, despite other treatments. HOME CARE INSTRUCTIONS   Drink  plenty of water. Water helps thin the mucus so your sinuses can drain more easily.  Use a humidifier.  Inhale steam 3 to 4 times a day (for example, sit in the bathroom with the shower running).  Apply a warm, moist washcloth to your face 3 to 4 times a day, or as directed by your  health care provider.  Use saline nasal sprays to help moisten and clean your sinuses.  Take medicines only as directed by your health care provider.  If you were prescribed either an antibiotic or antifungal medicine, finish it all even if you start to feel better. SEEK IMMEDIATE MEDICAL CARE IF:  You have increasing pain or severe headaches.  You have nausea, vomiting, or drowsiness.  You have swelling around your face.  You have vision problems.  You have a stiff neck.  You have difficulty breathing. MAKE SURE YOU:   Understand these instructions.  Will watch your condition.  Will get help right away if you are not doing well or get worse. Document Released: 11/29/2005 Document Revised: 04/15/2014 Document Reviewed: 12/14/2011 [facility] Patient Information 2015 [place], [place]. This information is not intended to replace advice given to you by your health care provider. Make sure you discuss any questions you have with your health care provider.

## 2014-09-27 ENCOUNTER — Other Ambulatory Visit: Payer: Self-pay

## 2015-05-12 ENCOUNTER — Ambulatory Visit (INDEPENDENT_AMBULATORY_CARE_PROVIDER_SITE_OTHER): Payer: BLUE CROSS/BLUE SHIELD

## 2015-05-12 ENCOUNTER — Ambulatory Visit (INDEPENDENT_AMBULATORY_CARE_PROVIDER_SITE_OTHER): Payer: BLUE CROSS/BLUE SHIELD | Admitting: Family Medicine

## 2015-05-12 VITALS — BP 114/78 | HR 74 | Temp 98.5°F | Resp 18 | Ht 71.0 in | Wt 223.0 lb

## 2015-05-12 DIAGNOSIS — M79662 Pain in left lower leg: Secondary | ICD-10-CM

## 2015-05-12 DIAGNOSIS — S86892A Other injury of other muscle(s) and tendon(s) at lower leg level, left leg, initial encounter: Secondary | ICD-10-CM

## 2015-05-12 NOTE — Patient Instructions (Signed)
Take Aleve (naproxen)440 mg (2220 mg) twice daily for pain and inflammation. Take with food  Continue to do nonweightbearing exercise and apply ice several times daily  Follow-up visit with your plan to get assessed for the best shoes for you  Continue to work on trying to lose weight  If you desire at anytime I'll be happy to make referral to sports medicine  Return at anytime if further concerns

## 2015-05-12 NOTE — Progress Notes (Signed)
  Subjective:  Patient ID: Allen Thomas, male    DOB: 02/18/1966  Age: 49 y.o. MRN: [id number]  49 year old man who has a history of intermittent shin pain from shin splints. He knows that when he stretches and restless sufficiently this comes on down. However recently he has also been having pain much more severely in the left shin slightly medially. No specific trauma. It is been about 3 weeks of the bad hurting. 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   Objective:   No acute distress. Somewhat overweight middle-aged man. He is tender mid left shin on the medial aspect of it. When he stands up to bear weight it hurts a lot. He walks with a limp. Vibratory motion with the tuning fork does not cause increased pain.  [facility] reading (PRIMARY) by  Dr. Alwyn RenHopper Normal tibia and fibula left leg.    Assessment & Plan:   Assessment: Leg pain Shin splint  Plan: Patient Instructions  Take Aleve (naproxen)440 mg (2220 mg) twice daily for pain and inflammation. Take with food  Continue to do nonweightbearing exercise and apply ice several times daily  Follow-up visit with your plan to get assessed for the best shoes for you  Continue to work on trying to lose weight  If you desire at anytime I'll be happy to make referral to sports medicine  Return at anytime if further concerns     Edessa Jakubowicz, MD 05/12/2015

## 2015-06-09 ENCOUNTER — Other Ambulatory Visit: Payer: Self-pay

## 2015-10-06 NOTE — Progress Notes (Signed)
Cardiology Office Note   Date:  10/07/2015   ID:  [name], DOB 08/30/1966, MRN [id number]  PCP:  Mickie Hillier, MD    Chief Complaint  Patient presents with  . Sleep Apnea      History of Present Illness: Allen Thomas is a 49 y.o. male who presents for followup of OSA.  He has a history of OSA and has been on CPAP for some time.  He is frustrated that he cannot lose weight.  He does aerobic exercise at least 4 times weekly on the elliptical 30-60 minutes daily.  He is on Paxil for anxiety.  He tolerates his CPAP well and feels rested in the am.  He tolerates his full face mask and feels the pressure is adequate.  He goes to bed at 8pm and gets up at 6am.  He does not really feel refreshed in the am.  He denies any SOB, dizziness, palpitations or syncope.  He complains of occasional pain in his chest that is sharp.  He has chronic left shoulder pain that is worse with movement but occasionally will notice a sharp pain in his chest.  He is concerned given his family history of CAD.     Past Medical History  Diagnosis Date  . Hypertension   . Anxiety   . Depression   . Sleep apnea     settings at 11   . Mixed dyslipidemia   . Obesity   . Elevated LFTs     Secondary to fatty liver  . History of tobacco abuse   . Tinnitus 2009    followed by ENT Dr Pollyann Kennedy  . Psoriasis     followed by Dermatologist Dr Yetta Barre  . Diabetes mellitus     Type II    Past Surgical History  Procedure Laterality Date  . Back surgery      L5-S1 back surgery   . Knee arthroscopy  07/27/2012    Procedure: ARTHROSCOPY KNEE;  Surgeon: Kathryne Hitch, MD;  Location: [facility];  Service: Orthopedics;  Laterality: Left;  Left knee arthroscopy with debridement and medial menisectomy  . Cardiac catheterization  2004    normal coronary arteries     Current Outpatient Prescriptions  Medication Sig Dispense Refill  . amLODipine (NORVASC) 10 MG tablet Take 10 mg by mouth  every morning.     . betamethasone dipropionate (DIPROLENE) 0.05 % ointment Apply 1 application topically daily.    . Choline Fenofibrate (TRILIPIX) 135 MG capsule Take 135 mg by mouth every evening.    . clidinium-chlordiazePOXIDE (LIBRAX) 2.5-5 MG per capsule Take 1 capsule by mouth 2 (two) times daily.    Marland Kitchen Coal Tar Extract (PSORIASIN) 1.25 % GEL Apply topically as needed. For psoriasis on elbows    . glimepiride (AMARYL) 1 MG tablet Take 1 mg by mouth daily with breakfast.    . lisinopril (PRINIVIL,ZESTRIL) 10 MG tablet Take 10 mg by mouth every morning.     Marland Kitchen LORazepam (ATIVAN) 1 MG tablet Take 0.25 mg by mouth 3 (three) times daily.  0  . metFORMIN (GLUCOPHAGE) 500 MG tablet Take 500 mg by mouth daily. Takes 500 in morning and 1,000 at night    . omega-3 acid ethyl esters (LOVAZA) 1 G capsule Take 4 g by mouth every evening.     Marland Kitchen PARoxetine (PAXIL) 40 MG tablet Take 40 mg by mouth every  evening.     . rosuvastatin (CRESTOR) 40 MG tablet Take 40 mg by mouth every evening.     No current facility-administered medications for this visit.    Allergies:   Review of patient's allergies indicates no known allergies.    Social History:  The patient  reports that he quit smoking about 11 years ago. He has quit using smokeless tobacco. He reports that he drinks alcohol. He reports that he does not use illicit drugs.   Family History:  The patient's family history includes Stroke in his brother.    ROS:  Please see the history of present illness.   Otherwise, review of systems are positive for none.   All other systems are reviewed and negative.    PHYSICAL EXAM: VS:  BP 115/76 mmHg  Pulse 80  Ht 5\' 11"  (1.803 m)  Wt 218 lb 1.9 oz (98.939 kg)  BMI 30.44 kg/m2 , BMI Body mass index is 30.44 kg/(m^2). GEN: Well nourished, well developed, in no acute distress HEENT: normal Neck: no JVD, carotid bruits, or masses Cardiac: RRR; no murmurs, rubs, or gallops,no edema  Respiratory:  clear to  auscultation bilaterally, normal work of breathing GI: soft, nontender, nondistended, + BS MS: no deformity or atrophy Skin: warm and dry, no rash Neuro:  Strength and sensation are intact Psych: euthymic mood, full affect   EKG:  EKG is not ordered today.    Recent Labs: No results found for requested labs within last 365 days.    Lipid Panel No results found for: CHOL, TRIG, HDL, CHOLHDL, VLDL, LDLCALC, LDLDIRECT    Wt Readings from Last 3 Encounters:  10/07/15 218 lb 1.9 oz (98.939 kg)  05/12/15 223 lb (101.152 kg)  09/05/14 220 lb 9.6 oz (100.064 kg)       ASSESSMENT AND PLAN:  1.  OSA on CPAP and tolerating well.  His d/l today showed an AHI of 2.7/hr on 14cm H2O and 93% compliance in using more than 4 hours.  He will continue on current settings. 2.  Obesity - I have encouraged him to continue to exercise and follow a strict diet. 3.  Atypical chest pain with a family history of CAD.  He also has HTN, dyslipidemia and DM.  He is very concerned that he may have CAD like his dad.  I will get an ETT to rule out ischemia and get a coronary calcium score. 4.  HTN - controlled on amlodipine, ACE I 5.  Dyslipidemia per PCP - continue statin   Current medicines are reviewed at length with the patient today.  The patient does not have concerns regarding medicines.  The following changes have been made:  no change  Labs/ tests ordered today: See above Assessment and Plan No orders of the defined types were placed in this encounter.     Disposition:   FU with me in 1 year  Signed, Quintella ReichertURNER,Jovoni Borkenhagen R, MD  10/07/2015 8:28 AM    [facility] [address], [place], [place]  [address] Phone: [phone]; Fax: [phone]

## 2015-10-07 ENCOUNTER — Encounter: Payer: Self-pay | Admitting: Cardiology

## 2015-10-07 ENCOUNTER — Ambulatory Visit (INDEPENDENT_AMBULATORY_CARE_PROVIDER_SITE_OTHER)
Admission: RE | Admit: 2015-10-07 | Discharge: 2015-10-07 | Disposition: A | Discharge status: Home / Self Care | Payer: Self-pay | Source: Ambulatory Visit | Attending: Cardiology | Admitting: Cardiology

## 2015-10-07 ENCOUNTER — Ambulatory Visit (INDEPENDENT_AMBULATORY_CARE_PROVIDER_SITE_OTHER): Payer: BLUE CROSS/BLUE SHIELD | Admitting: Cardiology

## 2015-10-07 VITALS — BP 115/76 | HR 80 | Ht 71.0 in | Wt 218.1 lb

## 2015-10-07 DIAGNOSIS — G4733 Obstructive sleep apnea (adult) (pediatric): Secondary | ICD-10-CM

## 2015-10-07 DIAGNOSIS — E669 Obesity, unspecified: Secondary | ICD-10-CM

## 2015-10-07 DIAGNOSIS — R079 Chest pain, unspecified: Secondary | ICD-10-CM

## 2015-10-07 DIAGNOSIS — I10 Essential (primary) hypertension: Secondary | ICD-10-CM

## 2015-10-07 DIAGNOSIS — R0789 Other chest pain: Secondary | ICD-10-CM | POA: Insufficient documentation

## 2015-10-07 NOTE — Patient Instructions (Signed)
Medication Instructions:  Your physician recommends that you continue on your current medications as directed. Please refer to the Current Medication list given to you today.   Labwork: None  Testing/Procedures: Your physician has requested that you have an exercise tolerance test. For further information please visit [email or web address]. Please also follow instruction sheet, as given.   Dr. Turner recommends you have a CALCIUM SCORE.  Follow-Up: Your physician wants you to follow-up in: 1 year with Dr. Turner. You will receive a reminder letter in the mail two months in advance. If you don't receive a letter, please call our office to schedule the follow-up appointment.   Any Other Special Instructions Will Be Listed Below (If Applicable).     If you need a refill on your cardiac medications before your next appointment, please call your pharmacy.   

## 2015-10-08 ENCOUNTER — Encounter: Payer: Self-pay | Admitting: Cardiology

## 2015-10-28 ENCOUNTER — Ambulatory Visit (INDEPENDENT_AMBULATORY_CARE_PROVIDER_SITE_OTHER): Payer: BLUE CROSS/BLUE SHIELD

## 2015-10-28 ENCOUNTER — Encounter: Payer: BLUE CROSS/BLUE SHIELD | Admitting: Nurse Practitioner

## 2015-10-28 DIAGNOSIS — R079 Chest pain, unspecified: Secondary | ICD-10-CM | POA: Diagnosis not present

## 2015-10-28 LAB — EXERCISE TOLERANCE TEST
CHL CUP MPHR: 173 {beats}/min
CHL CUP RESTING HR STRESS: 86 {beats}/min
CHL RATE OF PERCEIVED EXERTION: 15
Estimated workload: 11.7 METS
Exercise duration (min): 10 min
Exercise duration (sec): 0 s
Peak HR: 173 {beats}/min
Percent HR: 100 %

## 2016-03-30 DIAGNOSIS — S61213A Laceration without foreign body of left middle finger without damage to nail, initial encounter: Secondary | ICD-10-CM | POA: Diagnosis not present

## 2016-03-31 DIAGNOSIS — M25562 Pain in left knee: Secondary | ICD-10-CM | POA: Diagnosis not present

## 2016-04-06 ENCOUNTER — Other Ambulatory Visit: Payer: Self-pay | Admitting: Physician Assistant

## 2016-04-06 DIAGNOSIS — M25562 Pain in left knee: Secondary | ICD-10-CM

## 2016-04-09 DIAGNOSIS — I10 Essential (primary) hypertension: Secondary | ICD-10-CM | POA: Diagnosis not present

## 2016-04-09 DIAGNOSIS — Z0000 Encounter for general adult medical examination without abnormal findings: Secondary | ICD-10-CM | POA: Diagnosis not present

## 2016-04-09 DIAGNOSIS — E119 Type 2 diabetes mellitus without complications: Secondary | ICD-10-CM | POA: Diagnosis not present

## 2016-04-09 DIAGNOSIS — E782 Mixed hyperlipidemia: Secondary | ICD-10-CM | POA: Diagnosis not present

## 2016-04-09 DIAGNOSIS — Z125 Encounter for screening for malignant neoplasm of prostate: Secondary | ICD-10-CM | POA: Diagnosis not present

## 2016-04-12 ENCOUNTER — Ambulatory Visit
Admission: RE | Admit: 2016-04-12 | Discharge: 2016-04-12 | Disposition: A | Discharge status: Home / Self Care | Payer: BLUE CROSS/BLUE SHIELD | Source: Ambulatory Visit | Attending: Physician Assistant | Admitting: Physician Assistant

## 2016-04-12 DIAGNOSIS — S83242A Other tear of medial meniscus, current injury, left knee, initial encounter: Secondary | ICD-10-CM | POA: Diagnosis not present

## 2016-04-12 DIAGNOSIS — M25562 Pain in left knee: Secondary | ICD-10-CM

## 2016-04-19 DIAGNOSIS — M25562 Pain in left knee: Secondary | ICD-10-CM | POA: Diagnosis not present

## 2016-04-19 DIAGNOSIS — M23204 Derangement of unspecified medial meniscus due to old tear or injury, left knee: Secondary | ICD-10-CM | POA: Diagnosis not present

## 2016-04-29 DIAGNOSIS — M23204 Derangement of unspecified medial meniscus due to old tear or injury, left knee: Secondary | ICD-10-CM | POA: Diagnosis not present

## 2016-04-29 DIAGNOSIS — S83232A Complex tear of medial meniscus, current injury, left knee, initial encounter: Secondary | ICD-10-CM | POA: Diagnosis not present

## 2016-04-29 DIAGNOSIS — M94262 Chondromalacia, left knee: Secondary | ICD-10-CM | POA: Diagnosis not present

## 2016-04-29 DIAGNOSIS — G8918 Other acute postprocedural pain: Secondary | ICD-10-CM | POA: Diagnosis not present

## 2016-06-17 DIAGNOSIS — M25562 Pain in left knee: Secondary | ICD-10-CM | POA: Diagnosis not present

## 2016-06-17 DIAGNOSIS — M23204 Derangement of unspecified medial meniscus due to old tear or injury, left knee: Secondary | ICD-10-CM | POA: Diagnosis not present

## 2016-07-17 DIAGNOSIS — K644 Residual hemorrhoidal skin tags: Secondary | ICD-10-CM | POA: Diagnosis not present

## 2016-08-02 DIAGNOSIS — M1712 Unilateral primary osteoarthritis, left knee: Secondary | ICD-10-CM | POA: Diagnosis not present

## 2016-08-06 ENCOUNTER — Encounter: Payer: Self-pay | Admitting: Cardiology

## 2016-08-08 ENCOUNTER — Encounter: Payer: Self-pay | Admitting: Cardiology

## 2016-08-08 NOTE — Progress Notes (Signed)
Cardiology Office Note   Date:  08/09/2016   ID:  Allen Thomas, DOB [birth year], MRN [id number]  PCP:  Mickie Hillier, MD  Cardiologist:  Dr. Mayford Knife     Chief Complaint  Patient presents with  . Palpitations      History of Present Illness: Allen Thomas is a 50 y.o. male who presents for fatigue, palpitations.   He has a history of OSA and has been on CPAP for some time. He is on Paxil for anxiety.  He tolerates his CPAP well and feels rested in the am.  He tolerates his full face mask and feels the pressure is adequate.  He goes to bed at 8pm and gets up at 6am.  He does not really feel refreshed in the am.  He denies any SOB, dizziness, palpitations or syncope.  a family history of CAD.  He also has HTN, dyslipidemia and DM.  He is very concerned that he may have CAD like his dad.   10/2015 with ETT no ischemia with exercise of 10 min.  Mild HTN responsive.  Rare PVC was noted.  Today he complains of "fluttering type" arrhthymias in the mornings after several cups of coffee.  No chest pain or SOB.  He had rare PVCs on ETT.  He is decreasing his coffee.  He is not as active as he had been since knee surgery.  He is walking some more at this point.  He has had labs at his PCP.  He uses his CPAP all the time to sleep.  Past Medical History:  Diagnosis Date  . Anxiety   . Depression   . Diabetes mellitus    Type II  . Elevated LFTs    Secondary to fatty liver  . History of tobacco abuse   . Hypertension   . Mixed dyslipidemia   . Obesity   . Psoriasis    followed by Dermatologist Dr Yetta Barre  . Sleep apnea    settings at 11   . Tinnitus 2009   followed by ENT Dr Pollyann Kennedy    Past Surgical History:  Procedure Laterality Date  . BACK SURGERY     L5-S1 back surgery   . CARDIAC CATHETERIZATION  2004   normal coronary arteries  . KNEE ARTHROSCOPY  07/27/2012   Procedure: ARTHROSCOPY KNEE;  Surgeon: Kathryne Hitch, MD;  Location: [facility];  Service: Orthopedics;   Laterality: Left;  Left knee arthroscopy with debridement and medial menisectomy     Current Outpatient Prescriptions  Medication Sig Dispense Refill  . betamethasone dipropionate (DIPROLENE) 0.05 % ointment Apply 1 application topically daily.    . Choline Fenofibrate (TRILIPIX) 135 MG capsule Take 135 mg by mouth every evening.    . clidinium-chlordiazePOXIDE (LIBRAX) 2.5-5 MG per capsule Take 1 capsule by mouth 2 (two) times daily.    Marland Kitchen Coal Tar Extract (PSORIASIN) 1.25 % GEL Apply topically as needed. For psoriasis on elbows    . lisinopril (PRINIVIL,ZESTRIL) 10 MG tablet Take 10 mg by mouth every morning.     Marland Kitchen LORazepam (ATIVAN) 1 MG tablet Take 0.25 mg by mouth 3 (three) times daily.   0  . metFORMIN (GLUCOPHAGE) 500 MG tablet Take 1,000 mg by mouth 2 (two) times daily with a meal. Takes 500 in morning and 1,000 at night    . omega-3 acid ethyl esters (LOVAZA) 1 G capsule Take 4 g by mouth every evening.     Marland Kitchen PARoxetine (PAXIL) 40 MG tablet Take  40 mg by mouth every evening.     . rosuvastatin (CRESTOR) 40 MG tablet Take 40 mg by mouth every evening.     No current facility-administered medications for this visit.     Allergies:   Review of patient's allergies indicates no known allergies.    Social History:  The patient  reports that he quit smoking about 12 years ago. He has quit using smokeless tobacco. He reports that he drinks alcohol. He reports that he does not use drugs.   Family History:  The patient's family history includes Stroke in his brother.    ROS:  General:no colds or fevers, no weight changes Skin:no rashes or ulcers HEENT:no blurred vision, no congestion CV:see HPI PUL:see HPI GI:no diarrhea constipation or melena, no indigestion GU:no hematuria, no dysuria MS:no joint pain, no claudication Neuro:no syncope, no lightheadedness Endo:+ diabetes, no thyroid disease recent TSH stable.   Wt Readings from Last 3 Encounters:  08/09/16 220 lb 12.8 oz (100.2  kg)  10/07/15 218 lb 1.9 oz (98.9 kg)  05/12/15 223 lb (101.2 kg)     PHYSICAL EXAM: VS:  BP 116/84   Pulse (!) 102   Ht 5\' 11"  (1.803 m)   Wt 220 lb 12.8 oz (100.2 kg)   BMI 30.80 kg/m  , BMI Body mass index is 30.8 kg/m. General:Pleasant affect, NAD Skin:Warm and dry, brisk capillary refill HEENT:normocephalic, sclera clear, mucus membranes moist Neck:supple, no JVD, no bruits  Heart:S1S2 RRR without murmur, gallup, rub or click Lungs:clear without rales, rhonchi, or wheezes BJY:NWGNAbd:soft, non tender, + BS, do not palpate liver spleen or masses Ext:no lower ext edema, 2+ pedal pulses, 2+ radial pulses Neuro:alert and oriented, MAE, follows commands, + facial symmetry    EKG:  EKG is ordered today. The ekg ordered today demonstrates slight ST at 102 no acute changes otherwise.     Recent Labs: No results found for requested labs within last 8760 hours.   Labs per Dr. Clarene DukeLittle, reviewed on pt's phone.   Lipid Panel No results found for: CHOL, TRIG, HDL, CHOLHDL, VLDL, LDLCALC, LDLDIRECT     Other studies Reviewed: Additional studies/ records that were reviewed today include:ETT see above .   ASSESSMENT AND PLAN:  1.  Fatigue has increased, but activity level decreased after knee surgery- encouraged to increase activity.  2. Palpitations- has had increase of caffeine. He is in class and sitting more and may be more aware.  He had labs with PCP, we will call for results.  But thyroid was normal.  He is weaning back on the caffeine. We discussed 24 hour monitor but he would prefer to wait.  I reassured this was more of a nuisance. He will call if continued problems.   No chest pain, no SOB.    3. OSA- uses his CPAP every night.  Brought his card and Dr. Mayford Knifeurner will review.   4. HTN- controlled today off of the amlodipine.  He continues lisinopril.  5. Dyslipidemia on crestor with lipids followed by PCP.    6. Elevated hepatic panel, related to fatty liver, discussed  decrease of sugars, wt loss.  7.  DM-2 followed by PCP    Current medicines are reviewed with the patient today.  The patient Has no concerns regarding medicines.  The following changes have been made:  See above Labs/ tests ordered today include:see above  Disposition:   FU:  see above  Signed, Nada BoozerLaura Javona Bergevin, NP  08/09/2016 8:09 AM    [facility] [place], [place], [place] Phone: [phone]; Fax: [phone]

## 2016-08-09 ENCOUNTER — Encounter: Payer: Self-pay | Admitting: Cardiology

## 2016-08-09 ENCOUNTER — Ambulatory Visit (INDEPENDENT_AMBULATORY_CARE_PROVIDER_SITE_OTHER): Payer: BLUE CROSS/BLUE SHIELD | Admitting: Cardiology

## 2016-08-09 VITALS — BP 116/84 | HR 102 | Ht 71.0 in | Wt 220.8 lb

## 2016-08-09 DIAGNOSIS — G4733 Obstructive sleep apnea (adult) (pediatric): Secondary | ICD-10-CM | POA: Diagnosis not present

## 2016-08-09 DIAGNOSIS — R5383 Other fatigue: Secondary | ICD-10-CM | POA: Diagnosis not present

## 2016-08-09 DIAGNOSIS — R945 Abnormal results of liver function studies: Secondary | ICD-10-CM

## 2016-08-09 DIAGNOSIS — R7989 Other specified abnormal findings of blood chemistry: Secondary | ICD-10-CM

## 2016-08-09 DIAGNOSIS — I10 Essential (primary) hypertension: Secondary | ICD-10-CM

## 2016-08-09 DIAGNOSIS — R002 Palpitations: Secondary | ICD-10-CM | POA: Diagnosis not present

## 2016-08-09 DIAGNOSIS — M1712 Unilateral primary osteoarthritis, left knee: Secondary | ICD-10-CM | POA: Diagnosis not present

## 2016-08-09 DIAGNOSIS — E785 Hyperlipidemia, unspecified: Secondary | ICD-10-CM

## 2016-08-09 NOTE — Patient Instructions (Signed)
Medication Instructions:  Your physician recommends that you continue on your current medications as directed. Please refer to the Current Medication list given to you today.   Labwork: We will get lab work from Dr. Clarene Duke and call you with any new recommendations.  Testing/Procedures: None ordered   Follow-Up: You will be notified with Dr. Norris Cross thoughts on your CPAP download.  Your physician wants you to follow-up in: 1 year with Dr. Mayford Knife. You will receive a reminder letter in the mail two months in advance. If you don't receive a letter, please call our office to schedule the follow-up appointment.   Any Other Special Instructions Will Be Listed Below (If Applicable). TRY TO CUT BACK ON YOUR CAFFEINE!  Premature Ventricular Contraction A premature ventricular contraction is an irregularity in the normal heart rhythm. These contractions are extra heartbeats that occur too early in the normal sequence. In most cases, these contractions are harmless and do not require treatment. CAUSES Premature ventricular contractions may occur without a known cause. In healthy people, the extra contractions may be caused by:  Smoking.  Drinking alcohol.  Caffeine.  Certain medicines.  Some illegal drugs.  Stress. Sometimes, changes in chemicals in the blood (electrolytes) can also cause premature ventricular contractions. They can also occur in people with heart diseases that cause a decrease in blood flow to the heart. SIGNS AND SYMPTOMS Premature ventricular contractions often do not cause any symptoms. In some cases, you may have a feeling of your heart beating fast or skipping a beat (palpitations). DIAGNOSIS Your health care provider will take your medical history and do a physical exam. During the exam, the health care provider will check for irregular heartbeats. Various tests may be done to help diagnose premature ventricular contractions. These tests may include:  An ECG  (electrocardiogram) to monitor the electrical activity of your heart.  Holter monitor testing. A Holter monitor is a portable device that can monitor the electrical activity of your heart over longer periods of time.  Stress tests to see how exercise affects your heart rhythm.  Echocardiogram. This test uses sound waves (ultrasound) to produce an image of your heart.  Electrophysiology study. This is used to evaluate the electrical conduction system of your heart. TREATMENT Usually, no treatment is needed. You may be advised to avoid things that can trigger the premature contractions, such as caffeine or alcohol. Medicines are sometimes given if symptoms are severe or if the extra heartbeats are very frequent. Treatment may also be needed for an underlying cause of the contractions if one is found. HOME CARE INSTRUCTIONS  Take medicines only as directed by your health care provider.  Make any lifestyle changes recommended by your health care provider. These may include:  Quitting smoking.  Avoiding or limiting caffeine or alcohol.  Exercising. Talk to your health care provider about what type of exercise is safe for you.  Trying to reduce stress.  Keep all follow-up visits with your health care provider. This is important. SEEK IMMEDIATE MEDICAL CARE IF:  You feel palpitations that are frequent or continual.  You have chest pain.  You have shortness of breath.  You have sweating for no reason.  You have nausea and vomiting.  You become light-headed or faint.   This information is not intended to replace advice given to you by your health care provider. Make sure you discuss any questions you have with your health care provider.   Document Released: 07/16/2004 Document Revised: 12/20/2014 Document Reviewed: 05/02/2014 [employer]  Interactive Patient Education [employer]2016 [employer]     If you need a refill on your cardiac medications before your next appointment, please call  your pharmacy.

## 2016-08-09 NOTE — Progress Notes (Signed)
Ok to see back in 1 year

## 2016-09-17 DIAGNOSIS — Z23 Encounter for immunization: Secondary | ICD-10-CM | POA: Diagnosis not present

## 2016-10-03 DIAGNOSIS — S93502A Unspecified sprain of left great toe, initial encounter: Secondary | ICD-10-CM | POA: Diagnosis not present

## 2016-10-25 DIAGNOSIS — F419 Anxiety disorder, unspecified: Secondary | ICD-10-CM | POA: Diagnosis not present

## 2016-10-25 DIAGNOSIS — R002 Palpitations: Secondary | ICD-10-CM | POA: Diagnosis not present

## 2016-10-26 ENCOUNTER — Telehealth: Payer: Self-pay | Admitting: Cardiology

## 2016-10-26 DIAGNOSIS — R002 Palpitations: Secondary | ICD-10-CM

## 2016-10-26 NOTE — Telephone Encounter (Signed)
New message    Patient c/o Palpitations:  High priority if patient c/o lightheadedness and shortness of breath.  1. How long have you been having palpitations? month 2. Are you currently experiencing lightheadedness and shortness of breath? no 3. Have you checked your BP and heart rate? (document readings) no 4. Are you experiencing any other symptoms? no

## 2016-10-26 NOTE — Telephone Encounter (Signed)
Please order a 30 day event monitor 

## 2016-10-26 NOTE — Telephone Encounter (Signed)
Event monitor ordered for scheduling. Patient agrees with treatment plan.  

## 2016-10-26 NOTE — Telephone Encounter (Signed)
Patient called to report intermittent palpitations for about a month. He decreased his caffeine and did not have palpitations for a while. He has a new job and it is more stressful. He states the palpitations seem to be worse after eating and does not associate them with certain food items. He reports he does not feel his heart is racing, but skipping beats.  He [facility] last night and was told his "EKG was fine." The patient states sometimes when he gets palpitations, he feels flushed. He reports no other symptoms and denies CP, SOB and swelling. He has no VS to report. He scheduled an OV with Dr. Mayford Knifeurner 11/29, but informed him this could change if Dr. Mayford Knifeurner makes any new orders (he mentioned wearing a heart monitor).   To Dr. Mayford Knifeurner for recommendations.

## 2016-11-08 ENCOUNTER — Telehealth: Payer: Self-pay | Admitting: Cardiology

## 2016-11-08 ENCOUNTER — Ambulatory Visit (INDEPENDENT_AMBULATORY_CARE_PROVIDER_SITE_OTHER): Payer: BLUE CROSS/BLUE SHIELD

## 2016-11-08 DIAGNOSIS — R002 Palpitations: Secondary | ICD-10-CM | POA: Diagnosis not present

## 2016-11-08 NOTE — Telephone Encounter (Signed)
Patient to have event monitor placed today.  Cancelled OV scheduled 11/29. Patient understands he will be called with results and if an earlier follow-up is neccessary, it will be arranged. Confirmed with patient recall is in to see Dr. Mayford Knifeurner next summer. He was grateful for assistance.

## 2016-11-08 NOTE — Telephone Encounter (Signed)
New message  Pt states that appt for Turner on 11/29 was to be canceled  Please contact pt to advise

## 2016-11-10 ENCOUNTER — Ambulatory Visit: Payer: BLUE CROSS/BLUE SHIELD | Admitting: Cardiology

## 2017-02-09 DIAGNOSIS — R05 Cough: Secondary | ICD-10-CM | POA: Diagnosis not present

## 2017-02-09 DIAGNOSIS — J069 Acute upper respiratory infection, unspecified: Secondary | ICD-10-CM | POA: Diagnosis not present

## 2017-03-15 DIAGNOSIS — G4733 Obstructive sleep apnea (adult) (pediatric): Secondary | ICD-10-CM | POA: Diagnosis not present

## 2017-03-29 ENCOUNTER — Encounter (INDEPENDENT_AMBULATORY_CARE_PROVIDER_SITE_OTHER): Payer: Self-pay | Admitting: Orthopaedic Surgery

## 2017-03-29 ENCOUNTER — Ambulatory Visit (INDEPENDENT_AMBULATORY_CARE_PROVIDER_SITE_OTHER): Payer: BLUE CROSS/BLUE SHIELD | Admitting: Orthopaedic Surgery

## 2017-03-29 VITALS — Ht 71.0 in | Wt 220.0 lb

## 2017-03-29 DIAGNOSIS — M25562 Pain in left knee: Secondary | ICD-10-CM | POA: Diagnosis not present

## 2017-03-29 DIAGNOSIS — G8929 Other chronic pain: Secondary | ICD-10-CM | POA: Diagnosis not present

## 2017-03-29 MED ORDER — METHYLPREDNISOLONE ACETATE 40 MG/ML IJ SUSP
40.0000 mg | INTRAMUSCULAR | Status: AC | PRN
  Administered 2017-03-29: 40 mg via INTRA_ARTICULAR

## 2017-03-29 NOTE — Progress Notes (Signed)
Office Visit Note   Patient: Allen Thomas           Date of Birth: 07/03/1966           MRN: [id number] Visit Date: 03/29/2017              Requested by: Catha Gosselin, MD [address] [place], [place] [address] PCP: Mickie Hillier, MD   Assessment & Plan: Visit Diagnoses:  1. Chronic pain of left knee     Plan: I do feel that he would benefit from a steroid injection in his knee we talked about this in detail. Also do feel that he is a candidate for hyaluronic acid. We will place steroid injection in his knee today after discussion of risk and benefits of the injections. We'll then place hyaluronic acid in his left knee at the next visit. He is in favor of this as well.  Follow-Up Instructions: Return in about 4 weeks (around 04/26/2017).   Orders:  No orders of the defined types were placed in this encounter.  No orders of the defined types were placed in this encounter.     Procedures: Large Joint Inj Date/Time: 03/29/2017 8:24 AM Performed by: Kathryne Hitch Authorized by: Kathryne Hitch   Location:  Knee Site:  L knee Ultrasound Guidance: No   Fluoroscopic Guidance: No   Arthrogram: No   Medications:  40 mg methylPREDNISolone acetate 40 MG/ML     Clinical Data: No additional findings.   Subjective: Chief Complaint  Patient presents with  . Left Knee - Pain    Left knee pain ongoing for couple months. NKI. Feels like it is full after prolonged activity. Questions if he had repeat injury. Feels like his knee has to pop. Medial sharp knee pain.   The patient is someone who we performed arthroscopic surgery on his left knee in May 2017. He had a significant medial meniscal tear and early cartilage changes on the medial compartment of his knee. He's been complaining of the knee feeling like it needs to pop and is not feeling good overall. He's had injections in the past but is been on long period of time. There is been no locking catching  but he feels again this is something is going on his knee. HPI  Review of Systems He denies any recent illnesses. Denies any fever, chills, nausea, vomiting, chest pain, headache, shortness of breath  Objective: Vital Signs: Ht  (1.803 m)   Wt 220 lb (99.8 kg)   BMI 30.68 kg/m   Physical Exam He is alert and oriented 3 and in no acute distress he does not walk with a significant limp Ortho Exam Examination of his left knee shows no effusion. There is some slight crepitation of the medial joint line that I can feel but a negative Murray's and negative Lachman sign, full range of motion. Specialty Comments:  No specialty comments available.  Imaging: No results found.   PMFS History: Patient Active Problem List   Diagnosis Date Noted  . Chest pain 10/07/2015  . Obstructive sleep apnea 12/07/2013  . Essential hypertension, benign 12/07/2013  . Obesity (BMI 30-39.9) 12/07/2013  . Family history of early CAD 12/07/2013  . Internal derangement of knee joint 07/27/2012   Past Medical History:  Diagnosis Date  . Anxiety   . Depression   . Diabetes mellitus    Type II  . Elevated LFTs    Secondary to fatty liver  . History of  tobacco abuse   . Hypertension   . Mixed dyslipidemia   . Obesity   . Psoriasis    followed by Dermatologist Dr Yetta Barre  . Sleep apnea    settings at 11   . Tinnitus 2009   followed by ENT Dr Pollyann Kennedy    Family History  Problem Relation Age of Onset  . Stroke Brother     Past Surgical History:  Procedure Laterality Date  . BACK SURGERY     L5-S1 back surgery   . CARDIAC CATHETERIZATION  2004   normal coronary arteries  . KNEE ARTHROSCOPY  07/27/2012   Procedure: ARTHROSCOPY KNEE;  Surgeon: Kathryne Hitch, MD;  Location: [facility];  Service: Orthopedics;  Laterality: Left;  Left knee arthroscopy with debridement and medial menisectomy   Social History   Occupational History  . Not on file.   Social History Main Topics  . Smoking  status: Former Smoker    Quit date: 12/14/2003  . Smokeless tobacco: Former Neurosurgeon  . Alcohol use Yes     Comment: rare  . Drug use: No  . Sexual activity: Not on file

## 2017-03-31 ENCOUNTER — Other Ambulatory Visit (INDEPENDENT_AMBULATORY_CARE_PROVIDER_SITE_OTHER): Payer: Self-pay

## 2017-04-09 DIAGNOSIS — E119 Type 2 diabetes mellitus without complications: Secondary | ICD-10-CM | POA: Diagnosis not present

## 2017-04-26 ENCOUNTER — Ambulatory Visit (INDEPENDENT_AMBULATORY_CARE_PROVIDER_SITE_OTHER): Payer: BLUE CROSS/BLUE SHIELD | Admitting: Orthopaedic Surgery

## 2017-04-29 DIAGNOSIS — R8290 Unspecified abnormal findings in urine: Secondary | ICD-10-CM | POA: Diagnosis not present

## 2017-04-29 DIAGNOSIS — Z125 Encounter for screening for malignant neoplasm of prostate: Secondary | ICD-10-CM | POA: Diagnosis not present

## 2017-04-29 DIAGNOSIS — Z0000 Encounter for general adult medical examination without abnormal findings: Secondary | ICD-10-CM | POA: Diagnosis not present

## 2017-04-29 DIAGNOSIS — E119 Type 2 diabetes mellitus without complications: Secondary | ICD-10-CM | POA: Diagnosis not present

## 2017-04-29 DIAGNOSIS — E782 Mixed hyperlipidemia: Secondary | ICD-10-CM | POA: Diagnosis not present

## 2017-05-03 ENCOUNTER — Other Ambulatory Visit (HOSPITAL_COMMUNITY): Payer: Self-pay | Admitting: Gastroenterology

## 2017-05-03 DIAGNOSIS — R748 Abnormal levels of other serum enzymes: Secondary | ICD-10-CM | POA: Diagnosis not present

## 2017-05-03 DIAGNOSIS — K760 Fatty (change of) liver, not elsewhere classified: Secondary | ICD-10-CM

## 2017-05-03 DIAGNOSIS — R7989 Other specified abnormal findings of blood chemistry: Secondary | ICD-10-CM

## 2017-05-03 DIAGNOSIS — R945 Abnormal results of liver function studies: Secondary | ICD-10-CM

## 2017-05-04 ENCOUNTER — Ambulatory Visit (INDEPENDENT_AMBULATORY_CARE_PROVIDER_SITE_OTHER): Payer: BLUE CROSS/BLUE SHIELD | Admitting: Orthopaedic Surgery

## 2017-05-12 ENCOUNTER — Ambulatory Visit (HOSPITAL_COMMUNITY)
Admission: RE | Admit: 2017-05-12 | Discharge: 2017-05-12 | Disposition: A | Discharge status: Home / Self Care | Payer: BLUE CROSS/BLUE SHIELD | Source: Ambulatory Visit | Attending: Gastroenterology | Admitting: Gastroenterology

## 2017-05-12 DIAGNOSIS — R945 Abnormal results of liver function studies: Secondary | ICD-10-CM | POA: Insufficient documentation

## 2017-05-12 DIAGNOSIS — K760 Fatty (change of) liver, not elsewhere classified: Secondary | ICD-10-CM

## 2017-05-12 DIAGNOSIS — R7989 Other specified abnormal findings of blood chemistry: Secondary | ICD-10-CM

## 2017-07-04 DIAGNOSIS — M5412 Radiculopathy, cervical region: Secondary | ICD-10-CM | POA: Diagnosis not present

## 2017-07-06 DIAGNOSIS — R350 Frequency of micturition: Secondary | ICD-10-CM | POA: Diagnosis not present

## 2017-07-06 DIAGNOSIS — R311 Benign essential microscopic hematuria: Secondary | ICD-10-CM | POA: Diagnosis not present

## 2017-07-22 DIAGNOSIS — R311 Benign essential microscopic hematuria: Secondary | ICD-10-CM | POA: Diagnosis not present

## 2017-07-22 DIAGNOSIS — R3129 Other microscopic hematuria: Secondary | ICD-10-CM | POA: Diagnosis not present

## 2017-07-28 DIAGNOSIS — R350 Frequency of micturition: Secondary | ICD-10-CM | POA: Diagnosis not present

## 2017-07-28 DIAGNOSIS — R311 Benign essential microscopic hematuria: Secondary | ICD-10-CM | POA: Diagnosis not present

## 2017-08-02 DIAGNOSIS — I10 Essential (primary) hypertension: Secondary | ICD-10-CM | POA: Diagnosis not present

## 2017-08-02 DIAGNOSIS — Z6829 Body mass index (BMI) 29.0-29.9, adult: Secondary | ICD-10-CM | POA: Diagnosis not present

## 2017-08-23 DIAGNOSIS — M5412 Radiculopathy, cervical region: Secondary | ICD-10-CM | POA: Diagnosis not present

## 2017-08-23 DIAGNOSIS — M542 Cervicalgia: Secondary | ICD-10-CM | POA: Diagnosis not present

## 2017-09-01 DIAGNOSIS — M5020 Other cervical disc displacement, unspecified cervical region: Secondary | ICD-10-CM | POA: Diagnosis not present

## 2017-09-01 DIAGNOSIS — M5412 Radiculopathy, cervical region: Secondary | ICD-10-CM | POA: Diagnosis not present

## 2017-09-23 DIAGNOSIS — Z6829 Body mass index (BMI) 29.0-29.9, adult: Secondary | ICD-10-CM | POA: Diagnosis not present

## 2017-09-23 DIAGNOSIS — M5020 Other cervical disc displacement, unspecified cervical region: Secondary | ICD-10-CM | POA: Diagnosis not present

## 2017-09-23 DIAGNOSIS — M5412 Radiculopathy, cervical region: Secondary | ICD-10-CM | POA: Diagnosis not present

## 2017-10-21 DIAGNOSIS — B079 Viral wart, unspecified: Secondary | ICD-10-CM | POA: Diagnosis not present

## 2018-03-24 DIAGNOSIS — L738 Other specified follicular disorders: Secondary | ICD-10-CM | POA: Diagnosis not present

## 2018-03-24 DIAGNOSIS — L400 Psoriasis vulgaris: Secondary | ICD-10-CM | POA: Diagnosis not present

## 2018-03-24 DIAGNOSIS — B078 Other viral warts: Secondary | ICD-10-CM | POA: Diagnosis not present

## 2018-04-06 IMAGING — US US ABDOMEN COMPLETE W/ ELASTOGRAPHY
1 series · 13 of 25 positions shown · non-contrast
Comparison: None.

CLINICAL DATA: Elevated liver function tests.  Fatty liver.



[Series 1: us abdomen complete w/ elastography · 0.25mm/px · 13 of 81 slices shown]
[im 1/81]
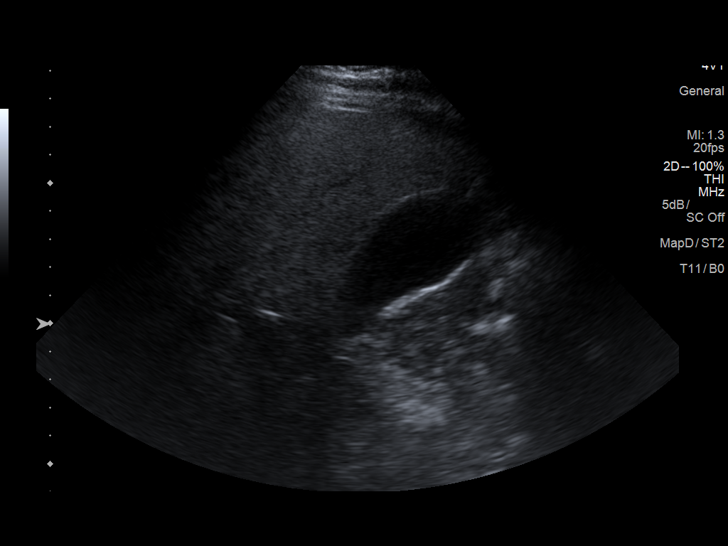
[im 7/81]
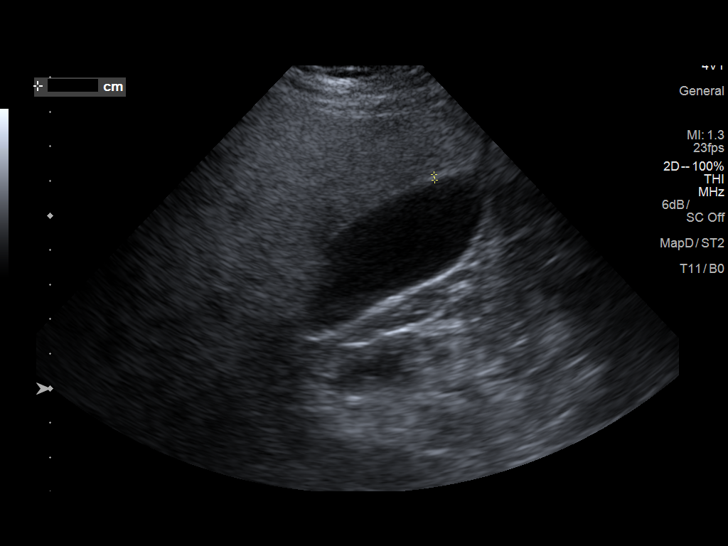
[im 14/81]
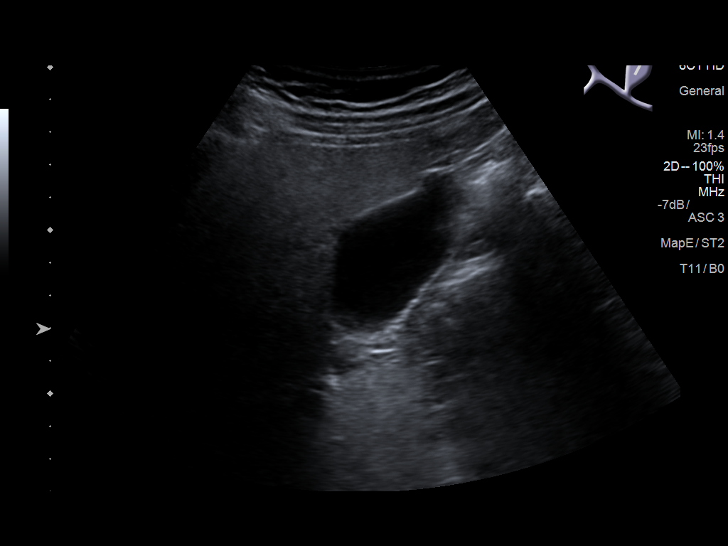
[im 21/81]
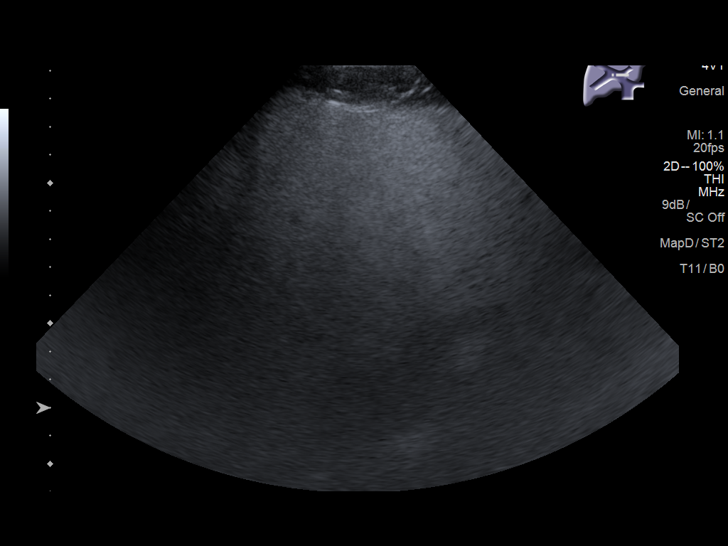
[im 27/81]
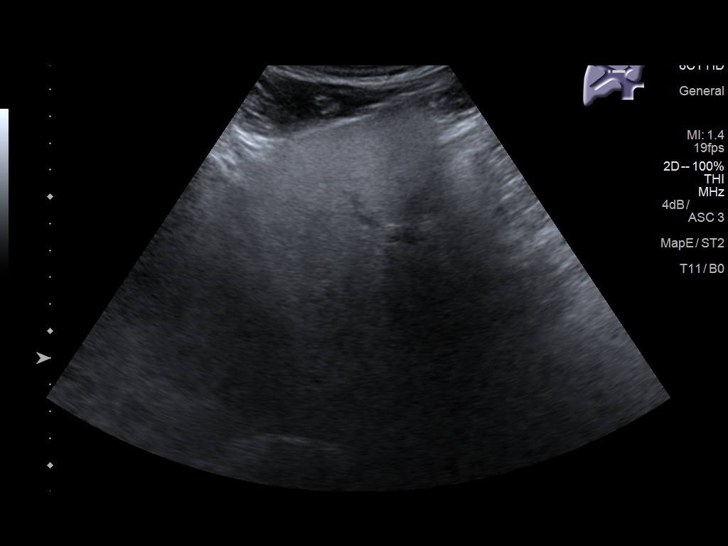
[im 34/81]
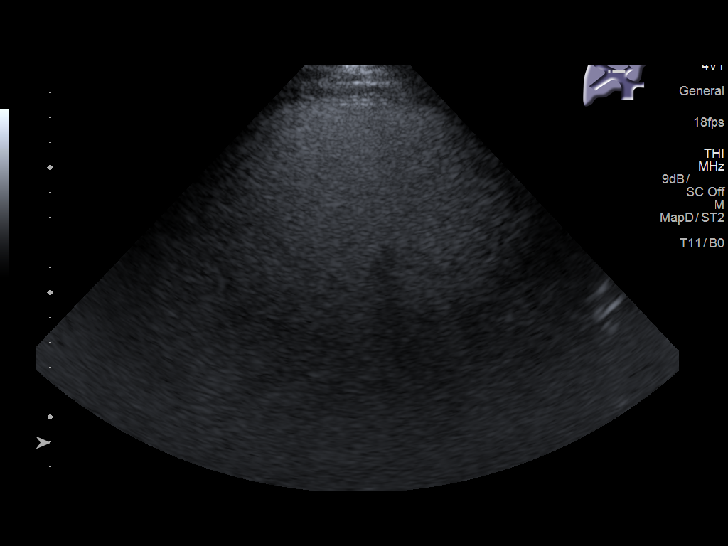
[im 41/81]
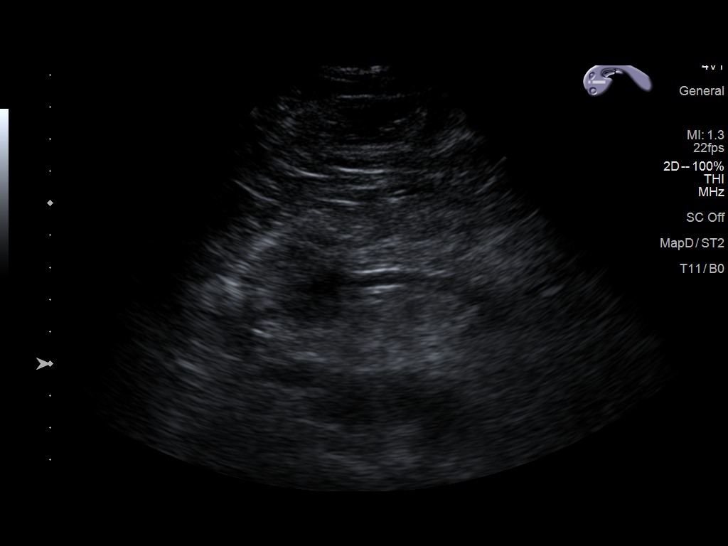
[im 47/81]
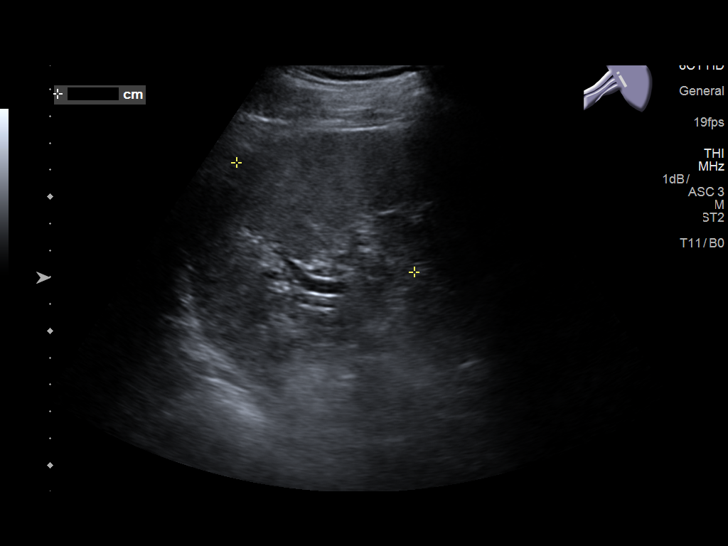
[im 54/81]
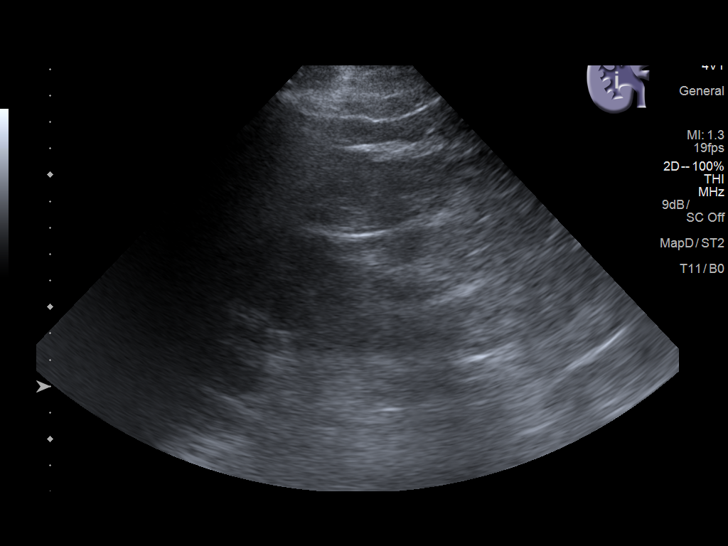
[im 61/81]
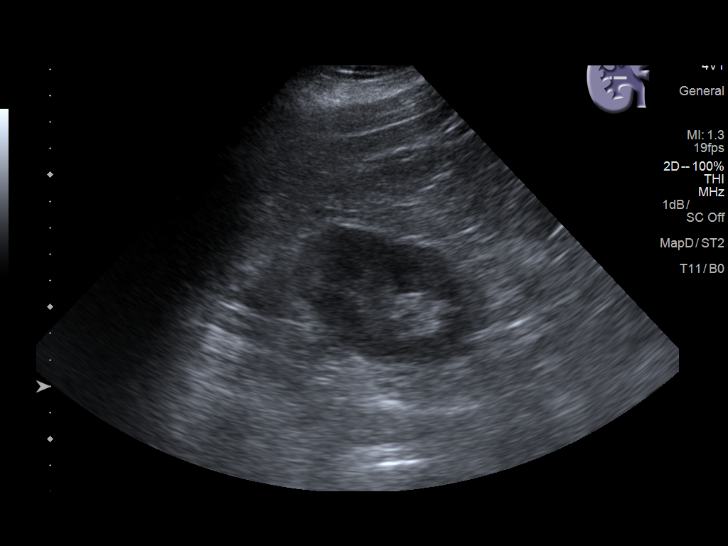
[im 67/81]
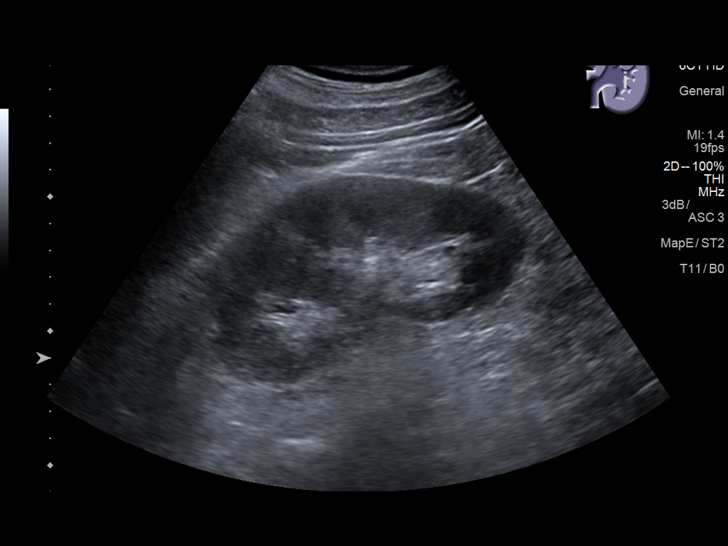
[im 74/81]
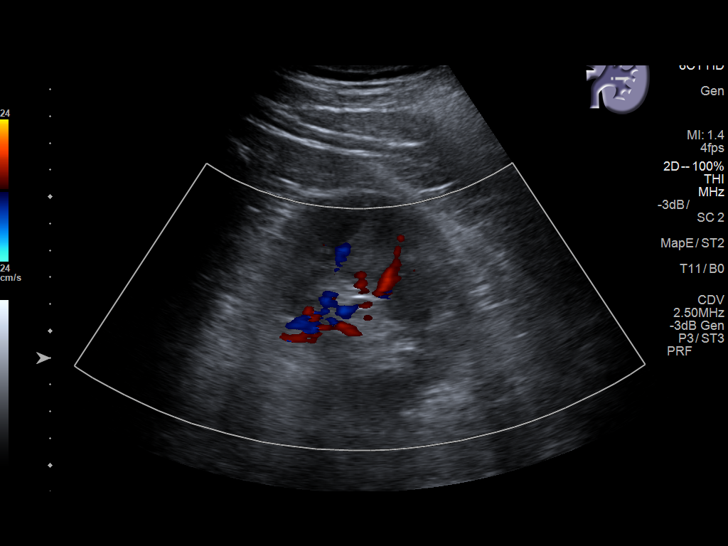
[im 81/81]
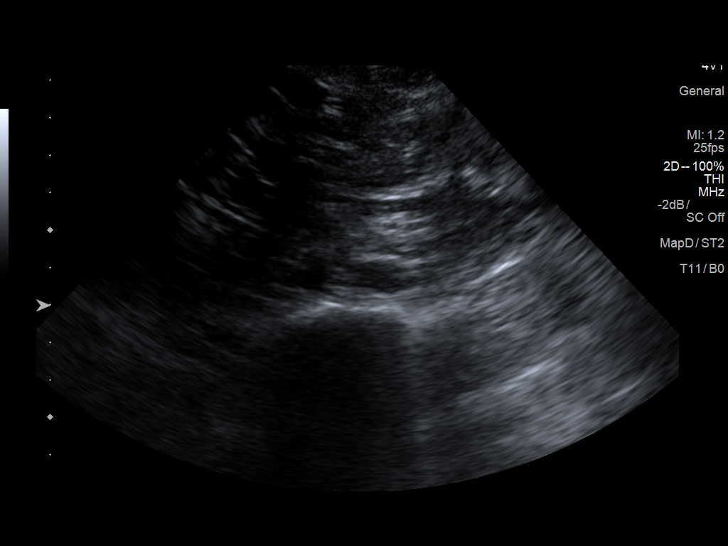

[13 of 25 positions shown; findings below may reference images not displayed]

FINDINGS: ULTRASOUND ABDOMEN

Gallbladder: No gallstones or wall thickening visualized. No
sonographic Murphy sign noted by sonographer.

Common bile duct: Diameter: 4 mm, within normal limits.

Liver: Markedly increased parenchymal echogenicity, consistent with
diffuse hepatic steatosis. Focal fatty sparing seen adjacent to
gallbladder fossa. No liver mass identified. Patent portal vein with
normal flow direction on color Doppler ultrasound.

IVC: No abnormality visualized.

Pancreas: Visualized portion unremarkable.

Spleen: Size and appearance within normal limits.

Right Kidney: Length: 13.3 cm. Echogenicity within normal limits. No
mass or hydronephrosis visualized.

Left Kidney: Length: 12.8 cm. Echogenicity within normal limits. No
mass or hydronephrosis visualized.

Abdominal aorta: No aneurysm visualized.

Other findings: None.

ULTRASOUND HEPATIC ELASTOGRAPHY

Device: Siemens Helix VTQ

Patient position: Oblique

Transducer 4V1

Number of measurements: 10

Hepatic segment:  8

Median velocity:   0.55  m/sec

IQR:

IQR/Median velocity ratio:

Corresponding Metavir fibrosis score:  F0/F1

Risk of fibrosis: Minimal

Limitations of exam: Patient unable to hold arms above head.

Pertinent findings noted on other imaging exams:  None

Please note that abnormal shear wave velocities may also be
identified in clinical settings other than with hepatic fibrosis,
such as: acute hepatitis, elevated right heart and central venous
pressures including use of beta blockers, Khloe disease
(Nomasibulele), infiltrative processes such as
mastocytosis/amyloidosis/infiltrative tumor, extrahepatic
cholestasis, in the post-prandial state, and liver transplantation.
Correlation with patient history, laboratory data, and clinical
condition recommended.
IMPRESSION: ULTRASOUND ABDOMEN:
Diffuse hepatic steatosis.  No liver mass visualized.

No evidence of gallstones, biliary dilatation, or other significant
abnormality.

ULTRASOUND HEPATIC ELASTOGRAPHY:

Median hepatic shear wave velocity is calculated at 0.55 m/sec.

Corresponding Metavir fibrosis score is  F0/F1.

Risk of fibrosis is Minimal.

Follow-up: None required

## 2018-05-04 DIAGNOSIS — E1165 Type 2 diabetes mellitus with hyperglycemia: Secondary | ICD-10-CM | POA: Diagnosis not present

## 2018-05-04 DIAGNOSIS — E782 Mixed hyperlipidemia: Secondary | ICD-10-CM | POA: Diagnosis not present

## 2018-05-04 DIAGNOSIS — Z0000 Encounter for general adult medical examination without abnormal findings: Secondary | ICD-10-CM | POA: Diagnosis not present

## 2018-05-04 DIAGNOSIS — E781 Pure hyperglyceridemia: Secondary | ICD-10-CM | POA: Diagnosis not present

## 2018-05-04 DIAGNOSIS — I10 Essential (primary) hypertension: Secondary | ICD-10-CM | POA: Diagnosis not present

## 2018-05-04 DIAGNOSIS — Z125 Encounter for screening for malignant neoplasm of prostate: Secondary | ICD-10-CM | POA: Diagnosis not present

## 2018-05-30 ENCOUNTER — Ambulatory Visit (INDEPENDENT_AMBULATORY_CARE_PROVIDER_SITE_OTHER): Payer: BLUE CROSS/BLUE SHIELD

## 2018-05-30 ENCOUNTER — Ambulatory Visit (INDEPENDENT_AMBULATORY_CARE_PROVIDER_SITE_OTHER): Payer: BLUE CROSS/BLUE SHIELD | Admitting: Orthopaedic Surgery

## 2018-05-30 ENCOUNTER — Encounter (INDEPENDENT_AMBULATORY_CARE_PROVIDER_SITE_OTHER): Payer: Self-pay | Admitting: Orthopaedic Surgery

## 2018-05-30 DIAGNOSIS — M25562 Pain in left knee: Secondary | ICD-10-CM

## 2018-05-30 DIAGNOSIS — G8929 Other chronic pain: Secondary | ICD-10-CM

## 2018-05-30 MED ORDER — LIDOCAINE HCL 1 % IJ SOLN
3.0000 mL | INTRAMUSCULAR | Status: AC | PRN
  Administered 2018-05-30: 3 mL

## 2018-05-30 MED ORDER — METHYLPREDNISOLONE ACETATE 40 MG/ML IJ SUSP
40.0000 mg | INTRAMUSCULAR | Status: AC | PRN
  Administered 2018-05-30: 40 mg via INTRA_ARTICULAR

## 2018-05-30 NOTE — Progress Notes (Signed)
Office Visit Note   Patient: Allen Thomas           Date of Birth: 08-11-1966           MRN: [id number] Visit Date: 05/30/2018              Requested by: Catha Gosselin, MD [address] [place], [place] [address] PCP: Catha Gosselin, MD   Assessment & Plan: Visit Diagnoses:  1. Chronic pain of left knee     Plan: I do feel that he would benefit from IT band stretching exercises for his left knee and I gave him information about this.  Also feel he would benefit from intra-articular steroid injection and he agrees with this as well.  He understands the risk and benefits of injections and did tolerate this well.  We will see him back in 4 weeks to see how is doing overall.  All questions concerns were answered and addressed.  Follow-Up Instructions: Return in about 1 month (around 06/27/2018).   Orders:  Orders Placed This Encounter  Procedures  . Large Joint Inj  . XR Knee 1-2 Views Left   No orders of the defined types were placed in this encounter.     Procedures: Large Joint Inj: L knee on 05/30/2018 8:45 AM Indications: diagnostic evaluation and pain Details: 22 G 1.5 in needle, superolateral approach  Arthrogram: No  Medications: 3 mL lidocaine 1 %; 40 mg methylPREDNISolone acetate 40 MG/ML Outcome: tolerated well, no immediate complications Procedure, treatment alternatives, risks and benefits explained, specific risks discussed. Consent was given by the patient. Immediately prior to procedure a time out was called to verify the correct patient, procedure, equipment, support staff and site/side marked as required. Patient was prepped and draped in the usual sterile fashion.       Clinical Data: No additional findings.   Subjective: Chief Complaint  Patient presents with  . Left Knee - Pain   The patient is only seen in the past.  He has had a history of 2 different in the left knee arthroscopies due to meniscal tearing.  He comes in today with the knee  feeling unstable to him.  He says it does pop and crack on occasion.  Some pivoting activities cause of pain.  He does have pain on the lateral aspect of his knee as well which is been new.  He does wear a knee brace on occasion but not a knee sleeve.  He is otherwise been active individual. HPI  Review of Systems He currently denies any headache, chest pain, shortness of breath, fever, chills, nausea, vomiting.  Objective: Vital Signs: There were no vitals taken for this visit.  Physical Exam Is alert and oriented x3 and in no acute distress Ortho Exam Examination of his left knee shows no effusion.  He does have some lateral joint line tenderness but no instability on exam.  His range of motion is full. Specialty Comments:  No specialty comments available.  Imaging: Xr Knee 1-2 Views Left  Result Date: 05/30/2018 2 views of the left knee show no acute findings.  There is slight medial joint space narrowing and slight patellofemoral arthritic changes.    PMFS History: Patient Active Problem List   Diagnosis Date Noted  . Chest pain 10/07/2015  . Obstructive sleep apnea 12/07/2013  . Essential hypertension, benign 12/07/2013  . Obesity (BMI 30-39.9) 12/07/2013  . Family history of early CAD 12/07/2013  . Internal derangement of knee joint 07/27/2012  Past Medical History:  Diagnosis Date  . Anxiety   . Depression   . Diabetes mellitus    Type II  . Elevated LFTs    Secondary to fatty liver  . History of tobacco abuse   . Hypertension   . Mixed dyslipidemia   . Obesity   . Psoriasis    followed by Dermatologist Dr Yetta BarreJones  . Sleep apnea    settings at 11   . Tinnitus 2009   followed by ENT Dr Pollyann Kennedyosen    Family History  Problem Relation Age of Onset  . Stroke Brother     Past Surgical History:  Procedure Laterality Date  . BACK SURGERY     L5-S1 back surgery   . CARDIAC CATHETERIZATION  2004   normal coronary arteries  . KNEE ARTHROSCOPY  07/27/2012    Procedure: ARTHROSCOPY KNEE;  Surgeon: Kathryne Hitchhristopher Y Arlette Schaad, MD;  Location: [facility];  Service: Orthopedics;  Laterality: Left;  Left knee arthroscopy with debridement and medial menisectomy   Social History   Occupational History  . Not on file  Tobacco Use  . Smoking status: Former Smoker    Last attempt to quit: 12/14/2003    Years since quitting: 14.4  . Smokeless tobacco: Former Engineer, waterUser  Substance and Sexual Activity  . Alcohol use: Yes    Comment: rare  . Drug use: No  . Sexual activity: Not on file

## 2018-06-28 ENCOUNTER — Ambulatory Visit (INDEPENDENT_AMBULATORY_CARE_PROVIDER_SITE_OTHER): Payer: BLUE CROSS/BLUE SHIELD | Admitting: Orthopaedic Surgery

## 2018-08-04 DIAGNOSIS — K5730 Diverticulosis of large intestine without perforation or abscess without bleeding: Secondary | ICD-10-CM | POA: Diagnosis not present

## 2018-08-04 DIAGNOSIS — Z1211 Encounter for screening for malignant neoplasm of colon: Secondary | ICD-10-CM | POA: Diagnosis not present

## 2018-08-28 DIAGNOSIS — G4733 Obstructive sleep apnea (adult) (pediatric): Secondary | ICD-10-CM | POA: Diagnosis not present

## 2018-10-15 DIAGNOSIS — Z23 Encounter for immunization: Secondary | ICD-10-CM | POA: Diagnosis not present

## 2018-12-10 DIAGNOSIS — M542 Cervicalgia: Secondary | ICD-10-CM | POA: Diagnosis not present

## 2018-12-28 DIAGNOSIS — M5412 Radiculopathy, cervical region: Secondary | ICD-10-CM | POA: Diagnosis not present

## 2019-01-03 ENCOUNTER — Other Ambulatory Visit: Payer: Self-pay | Admitting: Student

## 2019-01-03 DIAGNOSIS — M5412 Radiculopathy, cervical region: Secondary | ICD-10-CM

## 2019-01-06 ENCOUNTER — Ambulatory Visit
Admission: RE | Admit: 2019-01-06 | Discharge: 2019-01-06 | Disposition: A | Discharge status: Home / Self Care | Payer: BLUE CROSS/BLUE SHIELD | Source: Ambulatory Visit | Attending: Student | Admitting: Student

## 2019-01-06 DIAGNOSIS — M5412 Radiculopathy, cervical region: Secondary | ICD-10-CM

## 2019-01-06 DIAGNOSIS — M4802 Spinal stenosis, cervical region: Secondary | ICD-10-CM | POA: Diagnosis not present

## 2019-01-10 DIAGNOSIS — Z6829 Body mass index (BMI) 29.0-29.9, adult: Secondary | ICD-10-CM | POA: Diagnosis not present

## 2019-01-10 DIAGNOSIS — I10 Essential (primary) hypertension: Secondary | ICD-10-CM | POA: Diagnosis not present

## 2019-01-10 DIAGNOSIS — M5412 Radiculopathy, cervical region: Secondary | ICD-10-CM | POA: Diagnosis not present

## 2019-01-15 DIAGNOSIS — M5020 Other cervical disc displacement, unspecified cervical region: Secondary | ICD-10-CM | POA: Diagnosis not present

## 2019-01-15 DIAGNOSIS — M50123 Cervical disc disorder at C6-C7 level with radiculopathy: Secondary | ICD-10-CM | POA: Diagnosis not present

## 2019-01-30 DIAGNOSIS — M5412 Radiculopathy, cervical region: Secondary | ICD-10-CM | POA: Diagnosis not present

## 2019-04-04 DIAGNOSIS — M5412 Radiculopathy, cervical region: Secondary | ICD-10-CM | POA: Diagnosis not present

## 2019-04-04 DIAGNOSIS — M542 Cervicalgia: Secondary | ICD-10-CM | POA: Diagnosis not present

## 2019-04-05 DIAGNOSIS — M5412 Radiculopathy, cervical region: Secondary | ICD-10-CM | POA: Diagnosis not present

## 2019-04-25 DIAGNOSIS — M50121 Cervical disc disorder at C4-C5 level with radiculopathy: Secondary | ICD-10-CM | POA: Diagnosis not present

## 2019-04-25 DIAGNOSIS — M5412 Radiculopathy, cervical region: Secondary | ICD-10-CM | POA: Diagnosis not present

## 2019-05-02 DIAGNOSIS — G4733 Obstructive sleep apnea (adult) (pediatric): Secondary | ICD-10-CM | POA: Diagnosis not present

## 2019-05-28 DIAGNOSIS — E119 Type 2 diabetes mellitus without complications: Secondary | ICD-10-CM | POA: Diagnosis not present

## 2019-05-28 DIAGNOSIS — Z0000 Encounter for general adult medical examination without abnormal findings: Secondary | ICD-10-CM | POA: Diagnosis not present

## 2019-05-28 DIAGNOSIS — E781 Pure hyperglyceridemia: Secondary | ICD-10-CM | POA: Diagnosis not present

## 2019-06-05 DIAGNOSIS — M5412 Radiculopathy, cervical region: Secondary | ICD-10-CM | POA: Diagnosis not present

## 2019-06-08 DIAGNOSIS — I10 Essential (primary) hypertension: Secondary | ICD-10-CM | POA: Diagnosis not present

## 2019-06-08 DIAGNOSIS — K760 Fatty (change of) liver, not elsewhere classified: Secondary | ICD-10-CM | POA: Diagnosis not present

## 2019-06-08 DIAGNOSIS — E782 Mixed hyperlipidemia: Secondary | ICD-10-CM | POA: Diagnosis not present

## 2019-06-08 DIAGNOSIS — E1169 Type 2 diabetes mellitus with other specified complication: Secondary | ICD-10-CM | POA: Diagnosis not present

## 2019-06-08 DIAGNOSIS — Z0000 Encounter for general adult medical examination without abnormal findings: Secondary | ICD-10-CM | POA: Diagnosis not present

## 2019-06-08 DIAGNOSIS — Z23 Encounter for immunization: Secondary | ICD-10-CM | POA: Diagnosis not present

## 2019-06-08 DIAGNOSIS — F419 Anxiety disorder, unspecified: Secondary | ICD-10-CM | POA: Diagnosis not present

## 2019-08-15 DIAGNOSIS — M5412 Radiculopathy, cervical region: Secondary | ICD-10-CM | POA: Diagnosis not present

## 2019-08-16 ENCOUNTER — Other Ambulatory Visit: Payer: Self-pay

## 2019-08-16 ENCOUNTER — Telehealth (INDEPENDENT_AMBULATORY_CARE_PROVIDER_SITE_OTHER): Payer: BC Managed Care – PPO | Admitting: Internal Medicine

## 2019-08-16 ENCOUNTER — Encounter: Payer: Self-pay | Admitting: Internal Medicine

## 2019-08-16 ENCOUNTER — Telehealth: Payer: Self-pay | Admitting: Internal Medicine

## 2019-08-16 VITALS — HR 75 | Wt 209.0 lb

## 2019-08-16 DIAGNOSIS — I10 Essential (primary) hypertension: Secondary | ICD-10-CM | POA: Diagnosis not present

## 2019-08-16 DIAGNOSIS — E782 Mixed hyperlipidemia: Secondary | ICD-10-CM | POA: Diagnosis not present

## 2019-08-16 DIAGNOSIS — G4733 Obstructive sleep apnea (adult) (pediatric): Secondary | ICD-10-CM

## 2019-08-16 DIAGNOSIS — Z8249 Family history of ischemic heart disease and other diseases of the circulatory system: Secondary | ICD-10-CM | POA: Diagnosis not present

## 2019-08-16 MED ORDER — ICOSAPENT ETHYL 1 G PO CAPS: 2.0000 g | ORAL_CAPSULE | Freq: Two times a day (BID) | ORAL | 11 refills | Status: DC

## 2019-08-16 NOTE — Telephone Encounter (Signed)
LVM for patient to call back and schedule 3 month lipid clinic appointment with Dr. Debara Pickett.

## 2019-08-16 NOTE — Patient Instructions (Signed)
Medication Instructions:  STOP lovaza START vascepa 2 gram twice daily = 2 capsules in AM, 2 capsules in PM -- a prior authorization with insurance may be required -- a copay card can be obtained from [email or web address]  If you need a refill on your cardiac medications before your next appointment, please call your pharmacy.   Lab work: FASTING lab work prior to next lipid clinic appointment - lipid panel, direct LDL, liver function testing If you have labs (blood work) drawn today and your tests are completely normal, you will receive your results only by: Marland Kitchen MyChart Message (if you have MyChart) OR . A paper copy in the mail If you have any lab test that is abnormal or we need to change your treatment, we will call you to review the results.  Follow-Up: Dr. Debara Pickett recommends that you schedule a follow up visit with him the in the [place] in 3 months. Please have fasting blood work about 1 week prior to this visit and he will review the blood work results with you at your appointment.

## 2019-08-16 NOTE — Progress Notes (Signed)
Virtual Visit via Video Note   This visit type was conducted due to national recommendations for restrictions regarding the COVID-19 Pandemic (e.g. social distancing) in an effort to limit this patient's exposure and mitigate transmission in our community.  Due to his co-morbid illnesses, this patient is at least at moderate risk for complications without adequate follow up.  This format is felt to be most appropriate for this patient at this time.  All issues noted in this document were discussed and addressed.  A limited physical exam was performed with this format.  Please refer to the patient's chart for his consent to telehealth for [facility].   Evaluation Performed:  Doxy.me video visit  Date:  08/16/2019   ID:  Allen Thomas, DOB Jul 14, 1966, MRN [id number]  Patient Location:  [address] [place] [address]  Provider location:   [address], [address] [place], [place] [address]  PCP:  Catha GosselinLittle, Kevin, MD  Cardiologist:  No primary care provider on file. Electrophysiologist:  None   Chief Complaint:  High triglycerides  History of Present Illness:    Allen Thomas is a 53 y.o. male who presents via audio/video conferencing for a telehealth visit today.  Allen Thomas is a pleasant 53 year old male who was [facility] with a history of dyslipidemia and early onset coronary disease.  He also has type 2 diabetes, mildly elevated liver enzymes, depression/anxiety on Paxil, and obesity which she attributes to the antidepressant medication.  It was previously followed by [employer] Systems[employer]remy Smart, Pharm.D. at [facility].  In 2012 he underwent heart catheterization by Dr. Deborah Chalkennant which showed no significant obstructive coronary disease.  Subsequently he was followed by Nada BoozerLaura Ingold and Dr. Mayford Knifeurner and had coronary calcium screening in 2016.  This was scored as 0 with no evidence of coronary disease.  He also has obstructive sleep apnea and has been on  CPAP.  He is currently on high intensity rosuvastatin, Lovaza, and fenofibrate.  His most recent lipid profile was on May 28, 2019.  Total cholesterol was 138, HDL 28, LDL 62 and triglycerides 413.  Hemoglobin A1c was 7.4.  TSH was 1.49.  He reports a reasonably healthy diet however during the COVID pandemic says that he is eaten more saturated foods and not been as strict about following carbohydrates.  The patient does not have symptoms concerning for COVID-19 infection (fever, chills, cough, or new SHORTNESS OF BREATH).    Prior CV studies:   The following studies were reviewed today:  Chart review, lab work  PMHx:  Past Medical History:  Diagnosis Date  . Anxiety   . Depression   . Diabetes mellitus    Type II  . Elevated LFTs    Secondary to fatty liver  . History of tobacco abuse   . Hypertension   . Mixed dyslipidemia   . Obesity   . Psoriasis    followed by Dermatologist Dr Yetta BarreJones  . Sleep apnea    settings at 11   . Tinnitus 2009   followed by ENT Dr Pollyann Kennedyosen    Past Surgical History:  Procedure Laterality Date  . BACK SURGERY     L5-S1 back surgery   . CARDIAC CATHETERIZATION  2004   normal coronary arteries  . KNEE ARTHROSCOPY  07/27/2012   Procedure: ARTHROSCOPY KNEE;  Surgeon: Kathryne Hitchhristopher Y Blackman, MD;  Location: [facility];  Service: Orthopedics;  Laterality: Left;  Left knee arthroscopy with debridement and medial menisectomy  FAMHx:  Family History  Problem Relation Age of Onset  . CAD Mother   . Stroke Brother     SOCHx:   reports that he quit smoking about 15 years ago. He has quit using smokeless tobacco. He reports current alcohol use. He reports that he does not use drugs.  ALLERGIES:  No Known Allergies  MEDS:  Current Meds  Medication Sig  . betamethasone dipropionate (DIPROLENE) 0.05 % ointment Apply 1 application topically daily.  . Choline Fenofibrate (TRILIPIX) 135 MG capsule Take 135 mg by mouth every evening.  Marland Kitchen CINNAMON PO Take by  mouth daily.  Marland Kitchen Coal Tar Extract (PSORIASIN) 1.25 % GEL Apply topically as needed. For psoriasis on elbows  . gabapentin (NEURONTIN) 100 MG capsule Take 100 mg by mouth as directed.  Marland Kitchen glimepiride (AMARYL) 2 MG tablet Take 1 tablet by mouth daily.  Marland Kitchen lisinopril (PRINIVIL,ZESTRIL) 10 MG tablet Take 10 mg by mouth every morning.   Marland Kitchen LORazepam (ATIVAN) 1 MG tablet Take 0.25 mg by mouth 3 (three) times daily.   . metFORMIN (GLUCOPHAGE) 500 MG tablet Take 1,000 mg by mouth 2 (two) times daily with a meal.   . Multiple Vitamin (MULTIVITAMIN) tablet Take 1 tablet by mouth daily.  Marland Kitchen omega-3 acid ethyl esters (LOVAZA) 1 G capsule Take 2 g by mouth 2 (two) times daily.   Marland Kitchen PARoxetine (PAXIL) 40 MG tablet Take 40 mg by mouth every evening.   . rosuvastatin (CRESTOR) 40 MG tablet Take 40 mg by mouth every evening.     ROS: Pertinent items noted in HPI and remainder of comprehensive ROS otherwise negative.  Labs/Other Tests and Data Reviewed:    Recent Labs: No results found for requested labs within last 8760 hours.   Recent Lipid Panel No results found for: CHOL, TRIG, HDL, CHOLHDL, LDLCALC, LDLDIRECT  Wt Readings from Last 3 Encounters:  08/16/19 209 lb (94.8 kg)  03/29/17 220 lb (99.8 kg)  08/09/16 220 lb 12.8 oz (100.2 kg)     Exam:    Vital Signs:  Pulse 75   Wt 209 lb (94.8 kg)   BMI 29.15 kg/m    General appearance: alert and no distress Lungs: No visual respiratory difficulty Abdomen: Overweight Extremities: extremities normal, atraumatic, no cyanosis or edema Skin: Skin color, texture, turgor normal. No rashes or lesions Neurologic: Mental status: Alert, oriented, thought content appropriate Psych: Pleasant  ASSESSMENT & PLAN:    1. Mixed dyslipidemia with high triglycerides 2. No evidence of coronary disease by cardiac cath (2012) 3. CAC score of 0 (2016) 4. Type 2 diabetes-A1c 7.4 5. OSA on CPAP 6. Essential hypertension  Mr. Montemayor has had a mixed dyslipidemia  with persistently elevated triglycerides.  Fortunately had no evidence of coronary disease by cath in 2012 and a 0 calcium score in 2016.  Despite that he remains at elevated risk with diabetes and additional risk factors.  He is also fairly sedentary.  He is a candidate for Vascepa for cardiovascular risk reduction.  The data is much clear for cardiovascular risk reduction, since there is no data for Lovaza for cardiovascular risk benefit and in fact the DHA component of Lovaza may actually raise LDL and negate any benefits of the EPA.  I recommend we switch him to Vascepa, repeating a lipid profile, direct LDL and liver enzymes in about 3 months.  His triglycerides may not be significantly lowered, however data from the reduce it trial shows if you are on medication in the study,  you received cardiovascular risk benefit whether your triglycerides lowered to below 150 mg/dL or not versus placebo.  Thanks again for the kind referral.  COVID-19 Education: The signs and symptoms of COVID-19 were discussed with the patient and how to seek care for testing (follow up with PCP or arrange E-visit).  The importance of social distancing was discussed today.  Patient Risk:   After full review of this patients clinical status, I feel that they are at least moderate risk at this time.  Time:   Today, I have spent 25 minutes with the patient with telehealth technology discussing dyslipidemia, elevated triglycerides, diabetes, dietary recommendations, cardiac procedures.     Medication Adjustments/Labs and Tests Ordered: Current medicines are reviewed at length with the patient today.  Concerns regarding medicines are outlined above.   Tests Ordered: Orders Placed This Encounter  Procedures  . Lipid panel  . LDL cholesterol, direct  . Hepatic function panel    Medication Changes: Meds ordered this encounter  Medications  . Icosapent Ethyl 1 g CAPS    Sig: Take 2 capsules (2 g total) by mouth 2 (two)  times daily.    Dispense:  120 capsule    Refill:  11    Disposition:  in 3 month(s)  Pixie Casino, MD, [facility], [place] Director of the [facility]  [facility] Diplomate of the American Board of Clinical Lipidology Attending Cardiologist  Direct Dial: [phone]  Fax: [phone]  Website:  [email or web address]  Pixie Casino, MD  08/16/2019 8:57 AM

## 2019-08-30 DIAGNOSIS — M5412 Radiculopathy, cervical region: Secondary | ICD-10-CM | POA: Diagnosis not present

## 2019-08-30 DIAGNOSIS — Z6830 Body mass index (BMI) 30.0-30.9, adult: Secondary | ICD-10-CM | POA: Diagnosis not present

## 2019-08-30 DIAGNOSIS — I10 Essential (primary) hypertension: Secondary | ICD-10-CM | POA: Diagnosis not present

## 2019-09-04 ENCOUNTER — Other Ambulatory Visit: Payer: Self-pay | Admitting: Neurological Surgery

## 2019-09-04 ENCOUNTER — Telehealth: Payer: Self-pay | Admitting: Nurse Practitioner

## 2019-09-04 DIAGNOSIS — E119 Type 2 diabetes mellitus without complications: Secondary | ICD-10-CM | POA: Diagnosis not present

## 2019-09-04 DIAGNOSIS — M5412 Radiculopathy, cervical region: Secondary | ICD-10-CM

## 2019-09-04 NOTE — Telephone Encounter (Signed)
Phone call to patient to verify medication list and allergies for myelogram procedure. Pt instructed to hold Paxil for 48hrs prior to myelogram appointment time. Pt verbalized understanding. Pre and post procedure instructions reviewed with pt. 

## 2019-09-07 ENCOUNTER — Encounter (HOSPITAL_COMMUNITY): Payer: Self-pay | Admitting: Emergency Medicine

## 2019-09-07 ENCOUNTER — Other Ambulatory Visit: Payer: Self-pay

## 2019-09-07 ENCOUNTER — Emergency Department (HOSPITAL_COMMUNITY)
Admission: EM | Admit: 2019-09-07 | Discharge: 2019-09-07 | Disposition: A | Discharge status: Home / Self Care | Payer: BC Managed Care – PPO | Attending: Emergency Medicine | Admitting: Emergency Medicine

## 2019-09-07 ENCOUNTER — Emergency Department (HOSPITAL_COMMUNITY): Payer: BC Managed Care – PPO

## 2019-09-07 DIAGNOSIS — Z87891 Personal history of nicotine dependence: Secondary | ICD-10-CM | POA: Diagnosis not present

## 2019-09-07 DIAGNOSIS — R0789 Other chest pain: Secondary | ICD-10-CM

## 2019-09-07 DIAGNOSIS — R079 Chest pain, unspecified: Secondary | ICD-10-CM | POA: Diagnosis not present

## 2019-09-07 DIAGNOSIS — Z79899 Other long term (current) drug therapy: Secondary | ICD-10-CM | POA: Insufficient documentation

## 2019-09-07 DIAGNOSIS — E119 Type 2 diabetes mellitus without complications: Secondary | ICD-10-CM | POA: Insufficient documentation

## 2019-09-07 DIAGNOSIS — E7800 Pure hypercholesterolemia, unspecified: Secondary | ICD-10-CM | POA: Insufficient documentation

## 2019-09-07 DIAGNOSIS — Z7984 Long term (current) use of oral hypoglycemic drugs: Secondary | ICD-10-CM | POA: Insufficient documentation

## 2019-09-07 DIAGNOSIS — Z20828 Contact with and (suspected) exposure to other viral communicable diseases: Secondary | ICD-10-CM | POA: Insufficient documentation

## 2019-09-07 DIAGNOSIS — I10 Essential (primary) hypertension: Secondary | ICD-10-CM | POA: Insufficient documentation

## 2019-09-07 DIAGNOSIS — R0602 Shortness of breath: Secondary | ICD-10-CM | POA: Diagnosis not present

## 2019-09-07 DIAGNOSIS — R52 Pain, unspecified: Secondary | ICD-10-CM | POA: Diagnosis not present

## 2019-09-07 DIAGNOSIS — R0902 Hypoxemia: Secondary | ICD-10-CM | POA: Diagnosis not present

## 2019-09-07 LAB — CBC WITH DIFFERENTIAL/PLATELET
Abs Immature Granulocytes: 0.02 10*3/uL (ref 0.00–0.07)
Basophils Absolute: 0 10*3/uL (ref 0.0–0.1)
Basophils Relative: 1 %
Eosinophils Absolute: 0.1 10*3/uL (ref 0.0–0.5)
Eosinophils Relative: 2 %
HCT: 38.9 % — ABNORMAL LOW (ref 39.0–52.0)
Hemoglobin: 13.5 g/dL (ref 13.0–17.0)
Immature Granulocytes: 0 %
Lymphocytes Relative: 45 %
Lymphs Abs: 2.9 10*3/uL (ref 0.7–4.0)
MCH: 30.2 pg (ref 26.0–34.0)
MCHC: 34.7 g/dL (ref 30.0–36.0)
MCV: 87 fL (ref 80.0–100.0)
Monocytes Absolute: 0.4 10*3/uL (ref 0.1–1.0)
Monocytes Relative: 7 %
Neutro Abs: 2.9 10*3/uL (ref 1.7–7.7)
Neutrophils Relative %: 45 %
Platelets: 314 10*3/uL (ref 150–400)
RBC: 4.47 MIL/uL (ref 4.22–5.81)
RDW: 12.2 % (ref 11.5–15.5)
WBC: 6.4 10*3/uL (ref 4.0–10.5)
nRBC: 0 % (ref 0.0–0.2)

## 2019-09-07 LAB — SARS CORONAVIRUS 2 BY RT PCR (HOSPITAL ORDER, PERFORMED IN ~~LOC~~ HOSPITAL LAB): SARS Coronavirus 2: NEGATIVE

## 2019-09-07 LAB — BASIC METABOLIC PANEL
Anion gap: 9 (ref 5–15)
BUN: 14 mg/dL (ref 6–20)
CO2: 23 mmol/L (ref 22–32)
Calcium: 9.6 mg/dL (ref 8.9–10.3)
Chloride: 107 mmol/L (ref 98–111)
Creatinine, Ser: 0.68 mg/dL (ref 0.61–1.24)
GFR calc Af Amer: >60 mL/min (ref >60)
GFR calc non Af Amer: >60 mL/min (ref >60)
Glucose, Bld: 134 mg/dL — ABNORMAL HIGH (ref 70–99)
Potassium: 3.8 mmol/L (ref 3.5–5.1)
Sodium: 139 mmol/L (ref 135–145)

## 2019-09-07 LAB — TROPONIN I (HIGH SENSITIVITY)
Troponin I (High Sensitivity): 4 ng/L (ref <18)
Troponin I (High Sensitivity): 5 ng/L (ref <18)

## 2019-09-07 MED ORDER — NITROGLYCERIN IN D5W 200-5 MCG/ML-% IV SOLN
0.0000 ug/min | INTRAVENOUS | Status: DC
  Administered 2019-09-07: 5 ug/min via INTRAVENOUS
  Filled 2019-09-07: qty 250

## 2019-09-07 MED ORDER — SODIUM CHLORIDE 0.9 % IV SOLN: INTRAVENOUS | Status: DC

## 2019-09-07 NOTE — Consult Note (Signed)
Cardiology Consultation:   Patient ID: Allen LoseRay F Mkrtchyan MRN: [id number]; DOB: 1966-08-15  Admit date: 09/07/2019 Date of Consult: 09/07/2019  Primary Care Provider: Catha GosselinLittle, Kevin, MD Primary Cardiologist: Chrystie NoseKenneth C Hilty, MD  Primary Electrophysiologist:  None    Patient Profile:   Allen Thomas is a 53 y.o. male with a hx of HTN, HLD, DM2, fatty liver, obesity, former smoker, and OSA who is being seen today for the evaluation of chest pain at the request of Dr. Freida BusmanAllen.  History of Present Illness:   Mr. Laural BenesJohnson with the above medical history reports a strong family history of CAD. He has a history of chest pain. Given his risk factors, he underwent heart cath 2012 which showed no significant obstructive disease. Calcium score obtained by CT 10/07/15 was 0.  Underwent POET 10/28/15 for atypical chest pain with ACS risk factors that was negative for ischemia.  He was recently seen by Dr. Rennis GoldenHilty 08/16/19 to re-establish care and for management of his mixed dyslipidemia.   [facility] of an episode of chest pain that occurred this morning.  He had been sitting at his desk for several hours.  When he got up he felt discomfort in his left chest.  There is no associated shortness of breath.  He did become diaphoretic and felt anxious about one could be going on.  He had no palpitations, lightheadedness, or dizziness.  He also denied nausea or vomiting.  Lately he has not been very active because of chronic back pain and cervical spine surgeries.  He does do yard work and has no exertional symptoms with this.  He denies any lower extremity edema, orthopnea, or PND.  He called EMS where his blood pressure was in the 140s systolic and otherwise vitals were unremarkable.  He received aspirin and 3 nitroglycerin with improvement in his chest pain.  He is currently chest pain-free and on a nitroglycerin infusion.  He does however, report headache.  In the ED cardiac enzymes were negative.  EKG was  unchanged from prior and shows no signs of acute ischemia.  Heart Pathway Score:     Past Medical History:  Diagnosis Date  . Anxiety   . Depression   . Diabetes mellitus    Type II  . Elevated LFTs    Secondary to fatty liver  . History of tobacco abuse   . Hypertension   . Mixed dyslipidemia   . Obesity   . Psoriasis    followed by Dermatologist Dr Yetta BarreJones  . Sleep apnea    settings at 11   . Tinnitus 2009   followed by ENT Dr Pollyann Kennedyosen    Past Surgical History:  Procedure Laterality Date  . BACK SURGERY     L5-S1 back surgery   . CARDIAC CATHETERIZATION  2004   normal coronary arteries  . KNEE ARTHROSCOPY  07/27/2012   Procedure: ARTHROSCOPY KNEE;  Surgeon: Kathryne Hitchhristopher Y Blackman, MD;  Location: [facility];  Service: Orthopedics;  Laterality: Left;  Left knee arthroscopy with debridement and medial menisectomy     Home Medications:  Prior to Admission medications   Medication Sig Start Date End Date Taking? Authorizing Provider  betamethasone dipropionate (DIPROLENE) 0.05 % ointment Apply 1 application topically daily.    [provider]  Choline Fenofibrate (TRILIPIX) 135 MG capsule Take 135 mg by mouth every evening.    [provider]  CINNAMON PO Take by mouth daily.    [provider]  Eustice & Johnson[employer]al Tar Extract (  PSORIASIN) 1.25 % GEL Apply topically as needed. For psoriasis on elbows    [provider]  gabapentin (NEURONTIN) 100 MG capsule Take 100 mg by mouth as directed. 08/15/19   [provider]  glimepiride (AMARYL) 2 MG tablet Take 1 tablet by mouth daily. 05/31/19   [provider]  Icosapent Ethyl 1 g CAPS Take 2 capsules (2 g total) by mouth 2 (two) times daily. 08/16/19   Hilty, Lisette Abu, MD  lisinopril (PRINIVIL,ZESTRIL) 10 MG tablet Take 10 mg by mouth every morning.     [provider]  LORazepam (ATIVAN) 1 MG tablet Take 0.25 mg by mouth 3 (three) times daily.  09/10/15   [provider]  metFORMIN  (GLUCOPHAGE) 500 MG tablet Take 1,000 mg by mouth 2 (two) times daily with a meal.     [provider]  Multiple Vitamin (MULTIVITAMIN) tablet Take 1 tablet by mouth daily.    [provider]  omega-3 acid ethyl esters (LOVAZA) 1 G capsule Take 2 g by mouth 2 (two) times daily.     [provider]  PARoxetine (PAXIL) 40 MG tablet Take 40 mg by mouth every evening.     [provider]  rosuvastatin (CRESTOR) 40 MG tablet Take 40 mg by mouth every evening.    [provider]    Inpatient Medications: Scheduled Meds:  Continuous Infusions: . sodium chloride    . nitroGLYCERIN 15 mcg/min (09/07/19 1600)   PRN Meds:   Allergies:   No Known Allergies  Social History:   Social History   Socioeconomic History  . Marital status: Married    Spouse name: Not on file  . Number of children: Not on file  . Years of education: Not on file  . Highest education level: Not on file  Occupational History  . Not on file  Social Needs  . Financial resource strain: Not on file  . Food insecurity    Worry: Not on file    Inability: Not on file  . Transportation needs    Medical: Not on file    Non-medical: Not on file  Tobacco Use  . Smoking status: Former Smoker    Quit date: 12/14/2003    Years since quitting: 15.7  . Smokeless tobacco: Former Engineer, water and Sexual Activity  . Alcohol use: Yes    Comment: rare  . Drug use: No  . Sexual activity: Not on file  Lifestyle  . Physical activity    Days per week: Not on file    Minutes per session: Not on file  . Stress: Not on file  Relationships  . Social Musician on phone: Not on file    Gets together: Not on file    Attends religious service: Not on file    Active member of club or organization: Not on file    Attends meetings of clubs or organizations: Not on file    Relationship status: Not on file  . Intimate partner violence    Fear of current or ex partner: Not on  file    Emotionally abused: Not on file    Physically abused: Not on file    Forced sexual activity: Not on file  Other Topics Concern  . Not on file  Social History Narrative  . Not on file    Family History:    Family History  Problem Relation Age of Onset  . CAD Mother   . Stroke  Brother      ROS:  Please see the history of present illness.   All other ROS reviewed and negative.     Physical Exam/Data:   Vitals:   09/07/19 1445 09/07/19 1500 09/07/19 1515 09/07/19 1615  BP: 131/86 135/83 123/83 132/81  Pulse: 71 75 85 77  Resp: (!) 24 (!) 21 (!) 22   Temp:      TempSrc:      SpO2: 96% 96% 97% 95%  Weight:      Height:       No intake or output data in the 24 hours ending 09/07/19 1645 Last 3 Weights 09/07/2019 08/16/2019 03/29/2017  Weight (lbs) 215 lb 209 lb 220 lb  Weight (kg) 97.523 kg 94.802 kg 99.791 kg     VS:  BP 132/81   Pulse 77   Temp 98.6 F (37 C) (Oral)   Resp (!) 22   Ht  (1.803 m)   Wt 97.5 kg   SpO2 95%   BMI 29.99 kg/m  , BMI Body mass index is 29.99 kg/m. GENERAL:  Well appearing HEENT: Pupils equal round and reactive, fundi not visualized, oral mucosa unremarkable NECK:  No jugular venous distention, waveform within normal limits, carotid upstroke brisk and symmetric, no bruits LUNGS:  Clear to auscultation bilaterally HEART:  RRR.  PMI not displaced or sustained,S1 and S2 within normal limits, no S3, no S4, no clicks, no rubs, no murmurs ABD:  Flat, positive bowel sounds normal in frequency in pitch, no bruits, no rebound, no guarding, no midline pulsatile mass, no hepatomegaly, no splenomegaly EXT:  2 plus pulses throughout, no edema, no cyanosis no clubbing SKIN:  No rashes no nodules NEURO:  Cranial nerves II through XII grossly intact, motor grossly intact throughout PSYCH:  Cognitively intact, oriented to person place and time   EKG:  The EKG was personally reviewed and demonstrates:  Sinus rhythm.  Rate 84 bpm.  Prior  inferior infarct Telemetry:  Telemetry was personally reviewed and demonstrates: Sinus rhythm.  No events.  Relevant CV Studies:  Coronary calcium score 2015/11/03: Coronary calcium score 0  ETT 10/28/2015: Negative stress test.  Poorly controlled blood pressure.  Event monitor 10/2016: PVCs  Laboratory Data:  High Sensitivity Troponin:   Recent Labs  Lab 09/07/19 1208 09/07/19 1430  TROPONINIHS 4 5     Chemistry Recent Labs  Lab 09/07/19 1208  NA 139  K 3.8  CL 107  CO2 23  GLUCOSE 134*  BUN 14  CREATININE 0.68  CALCIUM 9.6  GFRNONAA >60  GFRAA >60  ANIONGAP 9    No results for input(s): PROT, ALBUMIN, AST, ALT, ALKPHOS, BILITOT in the last 168 hours. Hematology Recent Labs  Lab 09/07/19 1208  WBC 6.4  RBC 4.47  HGB 13.5  HCT 38.9*  MCV 87.0  MCH 30.2  MCHC 34.7  RDW 12.2  PLT 314   BNPNo results for input(s): BNP, PROBNP in the last 168 hours.  DDimer No results for input(s): DDIMER in the last 168 hours.   Radiology/Studies:  Dg Chest Port 1 View  Result Date: 09/07/2019 CLINICAL DATA:  Mid to LEFT side chest pain, shortness of breath, history hypertension, former smoker, type II diabetes mellitus EXAM: PORTABLE CHEST 1 VIEW COMPARISON:  Portable exam 1231 hours compared to 12/13/2012 FINDINGS: Normal heart size, mediastinal contours, and pulmonary vascularity. Lungs clear. No pulmonary infiltrate, pleural effusion or pneumothorax. Disc prosthesis at inferior cervical spine at C6-C7. IMPRESSION: No acute abnormalities. Electronically  Signed   By: Lavonia Dana M.D.   On: 09/07/2019 12:55    Assessment and Plan:   # Atypical chest pain: Mr. Googe had an episode of atypical chest pain this morning.  He has not had any exertional symptoms.  High-sensitivity troponin has been within normal limits x2.  EKG is unchanged from prior.  Given his calcium score of zero 4 years ago and the above findings, it is very unlikely that this represents ACS.  I suspect  that it is musculoskeletal related to his neck and sitting for prolonged periods of time.  He is going to work on getting up and moving more, stretching, and using ice/heat.  We will arrange follow-up with Dr. Theodosia Blender office.  If he has persistent symptoms then additional stress testing could be considered.  He is stable for discharge from the ED.  [facility] will sign off.   Medication Recommendations:  none Other recommendations (labs, testing, etc):  none Follow up as an outpatient:  We will arrange  For questions or updates, please contact [place] Please consult [email or web address] for contact info under     Signed, Ledora Bottcher, PA  09/07/2019 4:45 PM

## 2019-09-07 NOTE — ED Triage Notes (Addendum)
Pt here with complaints of left sided chest pain. Given 1 ASA and 3 nitro with no relief. Complains of SOB. HX of hypertension and high cholesterol.    146/88 94 HR 96% RA CBG 146

## 2019-09-07 NOTE — ED Provider Notes (Signed)
[facility] EMERGENCY DEPARTMENT Provider Note   CSN: [id number] Arrival date & time: 09/07/19  1132     History   Chief Complaint No chief complaint on file.   HPI Allen Thomas is a 52 y.o. male.     53 year old male with history of hypertension, hyperlipidemia, type 2 diabetes who presents with chest pain x1 day.  Patient has had similar symptoms in for the past which has been evaluated with a heart catheterization back in 2002.  Patient states that he had a coronary calcium score test done 4 years ago which was negative.  Does have an extremely strong family history of coronary disease with relatives having ACS before age of 27.  Chest pain has been intermittent and lasted for several minutes.  No associated dyspnea, diaphoresis.  No nausea.  No recent cough or congestion.  No fever or chills.  Symptoms seem to wax and wane and nothing makes them better or worse.  Denies any leg pain or swelling.  No treatment use prior to arrival     Past Medical History:  Diagnosis Date  . Anxiety   . Depression   . Diabetes mellitus    Type II  . Elevated LFTs    Secondary to fatty liver  . History of tobacco abuse   . Hypertension   . Mixed dyslipidemia   . Obesity   . Psoriasis    followed by Dermatologist Dr Yetta Barre  . Sleep apnea    settings at 11   . Tinnitus 2009   followed by ENT Dr Pollyann Kennedy    Patient Active Problem List   Diagnosis Date Noted  . Chest pain 10/07/2015  . Obstructive sleep apnea 12/07/2013  . Essential hypertension, benign 12/07/2013  . Obesity (BMI 30-39.9) 12/07/2013  . Family history of early CAD 12/07/2013  . Internal derangement of knee joint 07/27/2012    Past Surgical History:  Procedure Laterality Date  . BACK SURGERY     L5-S1 back surgery   . CARDIAC CATHETERIZATION  2004   normal coronary arteries  . KNEE ARTHROSCOPY  07/27/2012   Procedure: ARTHROSCOPY KNEE;  Surgeon: Kathryne Hitch, MD;  Location: [facility];   Service: Orthopedics;  Laterality: Left;  Left knee arthroscopy with debridement and medial menisectomy        Home Medications    Prior to Admission medications   Medication Sig Start Date End Date Taking? Authorizing Provider  betamethasone dipropionate (DIPROLENE) 0.05 % ointment Apply 1 application topically daily.    [provider]  Choline Fenofibrate (TRILIPIX) 135 MG capsule Take 135 mg by mouth every evening.    [provider]  CINNAMON PO Take by mouth daily.    [provider]  Coal Tar Extract (PSORIASIN) 1.25 % GEL Apply topically as needed. For psoriasis on elbows    [provider]  gabapentin (NEURONTIN) 100 MG capsule Take 100 mg by mouth as directed. 08/15/19   [provider]  glimepiride (AMARYL) 2 MG tablet Take 1 tablet by mouth daily. 05/31/19   [provider]  Icosapent Ethyl 1 g CAPS Take 2 capsules (2 g total) by mouth 2 (two) times daily. 08/16/19   Hilty, Lisette Abu, MD  lisinopril (PRINIVIL,ZESTRIL) 10 MG tablet Take 10 mg by mouth every morning.     [provider]  LORazepam (ATIVAN) 1 MG tablet Take 0.25 mg by mouth 3 (three) times daily.  09/10/15   [provider]  metFORMIN (GLUCOPHAGE)  500 MG tablet Take 1,000 mg by mouth 2 (two) times daily with a meal.     [provider]  Multiple Vitamin (MULTIVITAMIN) tablet Take 1 tablet by mouth daily.    [provider]  omega-3 acid ethyl esters (LOVAZA) 1 G capsule Take 2 g by mouth 2 (two) times daily.     [provider]  PARoxetine (PAXIL) 40 MG tablet Take 40 mg by mouth every evening.     [provider]  rosuvastatin (CRESTOR) 40 MG tablet Take 40 mg by mouth every evening.    [provider]    Family History Family History  Problem Relation Age of Onset  . CAD Mother   . Stroke Brother     Social History Social History   Tobacco Use  . Smoking status: Former Smoker    Quit date:  12/14/2003    Years since quitting: 15.7  . Smokeless tobacco: Former Engineer, waterUser  Substance Use Topics  . Alcohol use: Yes    Comment: rare  . Drug use: No     Allergies   Patient has no known allergies.   Review of Systems Review of Systems  All other systems reviewed and are negative.    Physical Exam Updated Vital Signs BP 122/68 (BP Location: Left Arm)   Pulse 84   Temp 98.6 F (37 C) (Oral)   Resp 16   Ht 1.803 m (5\' 11" )   Wt 97.5 kg   SpO2 96%   BMI 29.99 kg/m   Physical Exam Vitals signs and nursing note reviewed.  Constitutional:      General: He is not in acute distress.    Appearance: Normal appearance. He is well-developed. He is not toxic-appearing.  HENT:     Head: Normocephalic and atraumatic.  Eyes:     General: Lids are normal.     Conjunctiva/sclera: Conjunctivae normal.     Pupils: Pupils are equal, round, and reactive to light.  Neck:     Musculoskeletal: Normal range of motion and neck supple.     Thyroid: No thyroid mass.     Trachea: No tracheal deviation.  Cardiovascular:     Rate and Rhythm: Normal rate and regular rhythm.     Heart sounds: Normal heart sounds. No murmur. No gallop.   Pulmonary:     Effort: Pulmonary effort is normal. No respiratory distress.     Breath sounds: Normal breath sounds. No stridor. No decreased breath sounds, wheezing, rhonchi or rales.  Abdominal:     General: Bowel sounds are normal. There is no distension.     Palpations: Abdomen is soft.     Tenderness: There is no abdominal tenderness. There is no rebound.  Musculoskeletal: Normal range of motion.        General: No tenderness.  Skin:    General: Skin is warm and dry.     Findings: No abrasion or rash.  Neurological:     Mental Status: He is alert and oriented to person, place, and time.     GCS: GCS eye subscore is 4. GCS verbal subscore is 5. GCS motor subscore is 6.     Cranial Nerves: No cranial nerve deficit.     Sensory: No sensory deficit.   Psychiatric:        Speech: Speech normal.        Behavior: Behavior normal.      ED Treatments / Results  Labs (all labs ordered are listed, but only abnormal  results are displayed) Labs Reviewed  SARS CORONAVIRUS 2 (HOSPITAL ORDER, [place] [facility])  CBC WITH DIFFERENTIAL/PLATELET  BASIC METABOLIC PANEL  TROPONIN I (HIGH SENSITIVITY)    EKG EKG Interpretation  Date/Time:  Friday September 07 2019 11:37:40 EDT Ventricular Rate:  84 PR Interval:    QRS Duration: 110 QT Interval:  393 QTC Calculation: 465 R Axis:   -61 Text Interpretation:  Normal sinus rhythm RSR' in V1 or V2, right VCD or RVH Inferior infarct, old Confirmed by Lacretia Leigh ([id number]) on 09/07/2019 11:49:53 AM   Radiology No results found.  Procedures Procedures (including critical care time)  Medications Ordered in ED Medications  0.9 %  sodium chloride infusion (has no administration in time range)  nitroGLYCERIN 50 mg in dextrose 5 % 250 mL (0.2 mg/mL) infusion (has no administration in time range)     Initial Impression / Assessment and Plan / ED Course  I have reviewed the triage vital signs and the nursing notes.  Pertinent labs & imaging results that were available during my care of the patient were reviewed by me and considered in my medical decision making (see chart for details).       Patient with negative delta troponin here.  Patient recently been seen by cardiology due to his baseline increased triglycerides.  Patient did receive nitroglycerin with some relief.  Placed on nitroglycerin drip here.  Cardiology consultation ordered.  Final Clinical Impressions(s) / ED Diagnoses   Final diagnoses:  None    ED Discharge Orders    None       Lacretia Leigh, MD 09/07/19 1534

## 2019-09-07 NOTE — ED Provider Notes (Signed)
Discussed with Dr. Oval Linsey who has cleared the patient for discharge.  They will arrange follow-up in the outpatient setting   Noemi Chapel, MD 09/07/19 1906

## 2019-09-07 NOTE — Discharge Instructions (Signed)
Please return to the emergency department immediately for severe or worsening symptoms  You may take a baby aspirin daily, follow-up with the heart doctor within the next week, see the number above for follow-up.  Optimally is being seen on Monday or Tuesday would be best, call first thing in the morning

## 2019-09-07 NOTE — ED Notes (Signed)
Patient verbalizes understanding of discharge instructions. Opportunity for questioning and answers were provided. Armband removed by staff, pt discharged from ED.  

## 2019-09-14 ENCOUNTER — Other Ambulatory Visit: Payer: BC Managed Care – PPO

## 2019-09-14 ENCOUNTER — Other Ambulatory Visit: Payer: Self-pay

## 2019-09-14 ENCOUNTER — Ambulatory Visit
Admission: RE | Admit: 2019-09-14 | Discharge: 2019-09-14 | Disposition: A | Discharge status: Home / Self Care | Payer: BC Managed Care – PPO | Source: Ambulatory Visit | Attending: Neurological Surgery | Admitting: Neurological Surgery

## 2019-09-14 DIAGNOSIS — M50222 Other cervical disc displacement at C5-C6 level: Secondary | ICD-10-CM | POA: Diagnosis not present

## 2019-09-14 DIAGNOSIS — M5412 Radiculopathy, cervical region: Secondary | ICD-10-CM

## 2019-09-14 DIAGNOSIS — M5023 Other cervical disc displacement, cervicothoracic region: Secondary | ICD-10-CM | POA: Diagnosis not present

## 2019-09-14 MED ORDER — MEPERIDINE HCL 50 MG/ML IJ SOLN
50.0000 mg | Freq: Once | INTRAMUSCULAR | Status: AC
  Administered 2019-09-14: 50 mg via INTRAMUSCULAR

## 2019-09-14 MED ORDER — ONDANSETRON HCL 4 MG/2ML IJ SOLN
4.0000 mg | Freq: Once | INTRAMUSCULAR | Status: AC
  Administered 2019-09-14: 4 mg via INTRAMUSCULAR

## 2019-09-14 MED ORDER — DIAZEPAM 5 MG PO TABS: 10.0000 mg | ORAL_TABLET | Freq: Once | ORAL | Status: DC

## 2019-09-14 MED ORDER — IOPAMIDOL (ISOVUE-M 300) INJECTION 61%
1.0000 mL | Freq: Once | INTRAMUSCULAR | Status: AC
  Administered 2019-09-14: 10 mL via INTRATHECAL

## 2019-09-14 NOTE — Progress Notes (Signed)
Pt reports he has been off of his Paxil for at least 24 hours. Educated pt to not take his Paxil today, he can resume it tomorrow 09/15/2019 at 0830, pt verbalized understanding.   Pt was also not given pre procedure Valium as pt took his own ativan this morning.

## 2019-09-14 NOTE — Discharge Instructions (Signed)
Myelogram Discharge Instructions  1. Go home and rest quietly for the next 24 hours.  It is important to lie flat for the next 24 hours.  Get up only to go to the restroom.  You may lie in the bed or on a couch on your back, your stomach, your left side or your right side.  You may have one pillow under your head.  You may have pillows between your knees while you are on your side or under your knees while you are on your back.  2. DO NOT drive today.  Recline the seat as far back as it will go, while still wearing your seat belt, on the way home.  3. You may get up to go to the bathroom as needed.  You may sit up for 10 minutes to eat.  You may resume your normal diet and medications unless otherwise indicated.  Drink lots of extra fluids today and tomorrow.  4. The incidence of headache, nausea, or vomiting is about 5% (one in 20 patients).  If you develop a headache, lie flat and drink plenty of fluids until the headache goes away.  Caffeinated beverages may be helpful.  If you develop severe nausea and vomiting or a headache that does not go away with flat bed rest, call [phone].  5. You may resume normal activities after your 24 hours of bed rest is over; however, do not exert yourself strongly or do any heavy lifting tomorrow. If when you get up you have a headache when standing, go back to bed and force fluids for another 24 hours.  6. Call your physician for a follow-up appointment.  The results of your myelogram will be sent directly to your physician by the following day.  7. If you have any questions or if complications develop after you arrive home, please call [phone].  Discharge instructions have been explained to the patient.  The patient, or the person responsible for the patient, fully understands these instructions.  YOU MAY RESUME YOUR PAXIL TOMORROW 09/15/2019 AT 0830.

## 2019-09-17 ENCOUNTER — Other Ambulatory Visit: Payer: BC Managed Care – PPO

## 2019-09-20 DIAGNOSIS — M5412 Radiculopathy, cervical region: Secondary | ICD-10-CM | POA: Diagnosis not present

## 2019-09-21 DIAGNOSIS — M5020 Other cervical disc displacement, unspecified cervical region: Secondary | ICD-10-CM | POA: Diagnosis not present

## 2019-09-21 DIAGNOSIS — M5412 Radiculopathy, cervical region: Secondary | ICD-10-CM | POA: Diagnosis not present

## 2019-10-14 DIAGNOSIS — R350 Frequency of micturition: Secondary | ICD-10-CM | POA: Diagnosis not present

## 2019-10-14 DIAGNOSIS — R319 Hematuria, unspecified: Secondary | ICD-10-CM | POA: Diagnosis not present

## 2019-10-30 DIAGNOSIS — R310 Gross hematuria: Secondary | ICD-10-CM | POA: Diagnosis not present

## 2019-11-15 ENCOUNTER — Telehealth: Payer: BC Managed Care – PPO | Admitting: Internal Medicine

## 2019-12-12 DIAGNOSIS — G4733 Obstructive sleep apnea (adult) (pediatric): Secondary | ICD-10-CM | POA: Diagnosis not present

## 2019-12-13 DIAGNOSIS — I10 Essential (primary) hypertension: Secondary | ICD-10-CM | POA: Diagnosis not present

## 2019-12-13 DIAGNOSIS — E782 Mixed hyperlipidemia: Secondary | ICD-10-CM | POA: Diagnosis not present

## 2019-12-13 DIAGNOSIS — K760 Fatty (change of) liver, not elsewhere classified: Secondary | ICD-10-CM | POA: Diagnosis not present

## 2019-12-13 DIAGNOSIS — F419 Anxiety disorder, unspecified: Secondary | ICD-10-CM | POA: Diagnosis not present

## 2019-12-17 DIAGNOSIS — G4733 Obstructive sleep apnea (adult) (pediatric): Secondary | ICD-10-CM | POA: Diagnosis not present

## 2019-12-19 ENCOUNTER — Telehealth: Payer: Self-pay | Admitting: *Deleted

## 2019-12-19 NOTE — Telephone Encounter (Signed)
Virtual Visit Pre-Appointment Phone Call  "(Name), I am calling you today to discuss your upcoming appointment. We are currently trying to limit exposure to the virus that causes COVID-19 by seeing patients at home rather than in the office."  1. "What is the BEST phone number to call the day of the visit?" - include this in appointment notes  2. "Do you have or have access to (through a family member/friend) a smartphone with video capability that we can use for your visit?" a. If yes - list this number in appt notes as "cell" (if different from BEST phone #) and list the appointment type as a VIDEO visit in appointment notes b. If no - list the appointment type as a PHONE visit in appointment notes  3. Confirm consent - "In the setting of the current Covid19 crisis, you are scheduled for a (phone or video) visit with your provider on (date) at (time).  Just as we do with many in-office visits, in order for you to participate in this visit, we must obtain consent.  If you'd like, I can send this to your mychart (if signed up) or email for you to review.  Otherwise, I can obtain your verbal consent now.  All virtual visits are billed to your insurance company just like a normal visit would be.  By agreeing to a virtual visit, we'd like you to understand that the technology does not allow for your provider to perform an examination, and thus may limit your provider's ability to fully assess your condition. If your provider identifies any concerns that need to be evaluated in person, we will make arrangements to do so.  Finally, though the technology is pretty good, we cannot assure that it will always work on either your or our end, and in the setting of a video visit, we may have to convert it to a phone-only visit.  In either situation, we cannot ensure that we have a secure connection.  Are you willing to proceed?" STAFF: Did the patient verbally acknowledge consent to telehealth visit? Document  YES/NO here: YES  4. Advise patient to be prepared - "Two hours prior to your appointment, go ahead and check your blood pressure, pulse, oxygen saturation, and your weight (if you have the equipment to check those) and write them all down. When your visit starts, your provider will ask you for this information. If you have an Apple Watch or Kardia device, please plan to have heart rate information ready on the day of your appointment. Please have a pen and paper handy nearby the day of the visit as well."  5. Give patient instructions for MyChart download to smartphone OR Doximity/Doxy.me as below if video visit (depending on what platform provider is using)  6. Inform patient they will receive a phone call 15 minutes prior to their appointment time (may be from unknown caller ID) so they should be prepared to answer    TELEPHONE CALL NOTE  Allen Thomas has been deemed a candidate for a follow-up tele-health visit to limit community exposure during the Covid-19 pandemic. I spoke with the patient via phone to ensure availability of phone/video source, confirm preferred email & phone number, and discuss instructions and expectations.  I reminded Allen Thomas to be prepared with any vital sign and/or heart rhythm information that could potentially be obtained via home monitoring, at the time of his visit. I reminded Allen Thomas to expect a phone call prior to  his visit.  Latrelle Dodrill, CMA 12/19/2019 3:57 PM   INSTRUCTIONS FOR DOWNLOADING THE MYCHART APP TO SMARTPHONE  - The patient must first make sure to have activated MyChart and know their login information - If Apple, go to [employer] and type in MyChart in the search bar and download the app. If Android, ask patient to go to [employer] and type in [place] in the search bar and download the app. The app is free but as with any other app downloads, their phone may require them to verify saved payment information or  Apple/Android password.  - The patient will need to then log into the app with their MyChart username and password, and select [facility] as their healthcare provider to link the account. When it is time for your visit, go to the MyChart app, find appointments, and click Begin Video Visit. Be sure to Select Allow for your device to access the Microphone and Camera for your visit. You will then be connected, and your provider will be with you shortly.  **If they have any issues connecting, or need assistance please contact MyChart service desk [phone])**  **If using a computer, in order to ensure the best quality for their visit they will need to use either of the following Internet Browsers: [employer] or Google Chrome**  IF USING DOXIMITY or DOXY.ME - The patient will receive a link just prior to their visit by text.     FULL LENGTH CONSENT FOR TELE-HEALTH VISIT   I hereby voluntarily request, consent and authorize [facility] and its employed or contracted physicians, physician assistants, nurse practitioners or other licensed health care professionals (the Practitioner), to provide me with telemedicine health care services (the "Services") as deemed necessary by the treating Practitioner. I acknowledge and consent to receive the Services by the Practitioner via telemedicine. I understand that the telemedicine visit will involve communicating with the Practitioner through live audiovisual communication technology and the disclosure of certain medical information by electronic transmission. I acknowledge that I have been given the opportunity to request an in-person assessment or other available alternative prior to the telemedicine visit and am voluntarily participating in the telemedicine visit.  I understand that I have the right to withhold or withdraw my consent to the use of telemedicine in the course of my care at any time, without affecting my right to future care  or treatment, and that the Practitioner or I may terminate the telemedicine visit at any time. I understand that I have the right to inspect all information obtained and/or recorded in the course of the telemedicine visit and may receive copies of available information for a reasonable fee.  I understand that some of the potential risks of receiving the Services via telemedicine include:  Marland Kitchen Delay or interruption in medical evaluation due to technological equipment failure or disruption; . Information transmitted may not be sufficient (e.g. poor resolution of images) to allow for appropriate medical decision making by the Practitioner; and/or  . In rare instances, security protocols could fail, causing a breach of personal health information.  Furthermore, I acknowledge that it is my responsibility to provide information about my medical history, conditions and care that is complete and accurate to the best of my ability. I acknowledge that Practitioner's advice, recommendations, and/or decision may be based on factors not within their control, such as incomplete or inaccurate data provided by me or distortions of diagnostic images or specimens that may result from electronic transmissions.  I understand that the practice of medicine is not an exact science and that Practitioner makes no warranties or guarantees regarding treatment outcomes. I acknowledge that I will receive a copy of this consent concurrently upon execution via email to the email address I last provided but may also request a printed copy by calling the office of [facility].    I understand that my insurance will be billed for this visit.   I have read or had this consent read to me. . I understand the contents of this consent, which adequately explains the benefits and risks of the Services being provided via telemedicine.  . I have been provided ample opportunity to ask questions regarding this consent and the Services and have had  my questions answered to my satisfaction. . I give my informed consent for the services to be provided through the use of telemedicine in my medical care  By participating in this telemedicine visit I agree to the above.

## 2019-12-21 DIAGNOSIS — E782 Mixed hyperlipidemia: Secondary | ICD-10-CM | POA: Diagnosis not present

## 2019-12-21 DIAGNOSIS — R310 Gross hematuria: Secondary | ICD-10-CM | POA: Diagnosis not present

## 2019-12-21 LAB — LIPID PANEL
Chol/HDL Ratio: 5.3 ratio — ABNORMAL HIGH (ref 0.0–5.0)
Cholesterol, Total: 127 mg/dL (ref 100–199)
HDL: 24 mg/dL — ABNORMAL LOW (ref >39)
LDL Chol Calc (NIH): 30 mg/dL (ref 0–99)
Triglycerides: 532 mg/dL — ABNORMAL HIGH (ref 0–149)
VLDL Cholesterol Cal: 73 mg/dL — ABNORMAL HIGH (ref 5–40)

## 2019-12-21 LAB — LDL CHOLESTEROL, DIRECT: LDL Direct: 40 mg/dL (ref 0–99)

## 2019-12-21 LAB — HEPATIC FUNCTION PANEL
ALT: 51 IU/L — ABNORMAL HIGH (ref 0–44)
AST: 38 IU/L (ref 0–40)
Albumin: 4.6 g/dL (ref 3.8–4.9)
Alkaline Phosphatase: 73 IU/L (ref 39–117)
Bilirubin Total: 0.7 mg/dL (ref 0.0–1.2)
Bilirubin, Direct: 0.17 mg/dL (ref 0.00–0.40)
Total Protein: 6.9 g/dL (ref 6.0–8.5)

## 2019-12-27 DIAGNOSIS — G4733 Obstructive sleep apnea (adult) (pediatric): Secondary | ICD-10-CM | POA: Diagnosis not present

## 2019-12-28 ENCOUNTER — Telehealth (INDEPENDENT_AMBULATORY_CARE_PROVIDER_SITE_OTHER): Payer: BC Managed Care – PPO | Admitting: Cardiology

## 2019-12-28 ENCOUNTER — Other Ambulatory Visit: Payer: Self-pay

## 2019-12-28 VITALS — Ht 71.0 in | Wt 206.0 lb

## 2019-12-28 DIAGNOSIS — I10 Essential (primary) hypertension: Secondary | ICD-10-CM | POA: Diagnosis not present

## 2019-12-28 DIAGNOSIS — E669 Obesity, unspecified: Secondary | ICD-10-CM | POA: Diagnosis not present

## 2019-12-28 DIAGNOSIS — G4733 Obstructive sleep apnea (adult) (pediatric): Secondary | ICD-10-CM

## 2019-12-28 NOTE — Progress Notes (Signed)
Virtual Visit via Video Note   This visit type was conducted due to national recommendations for restrictions regarding the COVID-19 Pandemic (e.g. social distancing) in an effort to limit this patient's exposure and mitigate transmission in our community.  Due to his co-morbid illnesses, this patient is at least at moderate risk for complications without adequate follow up.  This format is felt to be most appropriate for this patient at this time.  The patient did not have access to video technology/had technical difficulties with video requiring transitioning to audio format only (telephone).  All issues noted in this document were discussed and addressed.  No physical exam could be performed with this format.  Please refer to the patient's chart for his  consent to telehealth for [facility].   Evaluation Performed:  Follow-up visit  This visit type was conducted due to national recommendations for restrictions regarding the COVID-19 Pandemic (e.g. social distancing).  This format is felt to be most appropriate for this patient at this time.  All issues noted in this document were discussed and addressed.  No physical exam was performed (except for noted visual exam findings with Video Visits).  Please refer to the patient's chart (MyChart message for video visits and phone note for telephone visits) for the patient's consent to telehealth for [facility].  Date:  12/28/2019   ID:  Allen Thomas, DOB July 28, 1966, MRN [id number]  Patient Location:  Home  Provider location:   [place]  PCP:  Sueanne Margarita, MD  Cardiologist:  Pixie Casino, MD  Sleep Medicine:  Fransico Him, MD Electrophysiologist:  None   Chief Complaint:  OSA  History of Present Illness:    Allen Thomas is a 54 y.o. male who presents via audio/video conferencing for a telehealth visit today.    Allen Thomas is a 54y.o. male who presents for followup of OSA.  He has a history of OSA and has been on CPAP  for some time.  he is doing well with his CPAP device and thinks that he has gotten used to it.  He tolerates the mask and feels the pressure is adequate.  Since going on CPAP he feels rested in the am and has no significant daytime sleepiness.  He denies any significant mouth or nasal dryness or nasal congestion.  He does not think that he snores.    The patient does not have symptoms concerning for COVID-19 infection (fever, chills, cough, or new shortness of breath).   Prior CV studies:   The following studies were reviewed today:  PAP compliance download  Past Medical History:  Diagnosis Date  . Anxiety   . Depression   . Diabetes mellitus    Type II  . Elevated LFTs    Secondary to fatty liver  . History of tobacco abuse   . Hypertension   . Mixed dyslipidemia   . Obesity   . Psoriasis    followed by Dermatologist Dr Ronnald Ramp  . Sleep apnea    settings at 11   . Tinnitus 2009   followed by ENT Dr Constance Holster   Past Surgical History:  Procedure Laterality Date  . BACK SURGERY     L5-S1 back surgery   . CARDIAC CATHETERIZATION  2004   normal coronary arteries  . KNEE ARTHROSCOPY  07/27/2012   Procedure: ARTHROSCOPY KNEE;  Surgeon: Mcarthur Rossetti, MD;  Location: [facility];  Service: Orthopedics;  Laterality: Left;  Left knee arthroscopy with debridement and medial  menisectomy     Current Meds  Medication Sig  . betamethasone dipropionate (DIPROLENE) 0.05 % ointment Apply 1 application topically daily.  . Choline Fenofibrate (TRILIPIX) 135 MG capsule Take 135 mg by mouth every evening.  Marland Kitchen CINNAMON PO Take by mouth daily.  Marland Kitchen Coal Tar Extract (PSORIASIN) 1.25 % GEL Apply topically as needed. For psoriasis on elbows  . glimepiride (AMARYL) 2 MG tablet Take 1 tablet by mouth daily.  Allen Thomas Ethyl 1 g CAPS Take 2 capsules (2 g total) by mouth 2 (two) times daily.  Marland Kitchen lisinopril (PRINIVIL,ZESTRIL) 10 MG tablet Take 10 mg by mouth every morning.   Marland Kitchen LORazepam (ATIVAN) 1 MG  tablet Take 0.25 mg by mouth 3 (three) times daily.   . metaxalone (SKELAXIN) 800 MG tablet Take 800 mg by mouth 3 (three) times daily as needed for muscle spasms.  . metFORMIN (GLUCOPHAGE) 500 MG tablet Take 1,000 mg by mouth 2 (two) times daily with a meal.   . Multiple Vitamin (MULTIVITAMIN) tablet Take 1 tablet by mouth daily.  Marland Kitchen PARoxetine (PAXIL) 40 MG tablet Take 40 mg by mouth every evening.   . rosuvastatin (CRESTOR) 40 MG tablet Take 40 mg by mouth every evening.     Allergies:   Patient has no known allergies.   Social History   Tobacco Use  . Smoking status: Former Smoker    Quit date: 12/14/2003    Years since quitting: 16.0  . Smokeless tobacco: Former Engineer, water Use Topics  . Alcohol use: Yes    Comment: rare  . Drug use: No     Family Hx: The patient's family history includes CAD in his mother; Stroke in his brother.  ROS:   Please see the history of present illness.     All other systems reviewed and are negative.   Labs/Other Tests and Data Reviewed:    Recent Labs: 09/07/2019: BUN 14; Creatinine, Ser 0.68; Hemoglobin 13.5; Platelets 314; Potassium 3.8; Sodium 139 12/21/2019: ALT 51   Recent Lipid Panel Lab Results  Component Value Date/Time   CHOL 127 12/21/2019 09:22 AM   TRIG 532 (H) 12/21/2019 09:22 AM   HDL 24 (L) 12/21/2019 09:22 AM   CHOLHDL 5.3 (H) 12/21/2019 09:22 AM   LDLCALC 30 12/21/2019 09:22 AM   LDLDIRECT 40 12/21/2019 09:22 AM    Wt Readings from Last 3 Encounters:  12/28/19 206 lb (93.4 kg)  09/07/19 215 lb (97.5 kg)  08/16/19 209 lb (94.8 kg)     Objective:    Vital Signs:  Ht 5\' 11"  (1.803 m)   Wt 206 lb (93.4 kg)   BMI 28.73 kg/m    CONSTITUTIONAL:  Well nourished, well developed male in no acute distress.  EYES: anicteric MOUTH: oral mucosa is pink RESPIRATORY: Normal respiratory effort, symmetric expansion CARDIOVASCULAR: No peripheral edema SKIN: No rash, lesions or ulcers MUSCULOSKELETAL: no digital  cyanosis NEURO: Cranial Nerves II-XII grossly intact, moves all extremities PSYCH: Intact judgement and insight.  A&O x 3, Mood/affect appropriate   ASSESSMENT & PLAN:    1.  OSA - The patient is tolerating PAP therapy well without any problems. The PAP download was reviewed today and showed an AHI of 1.8/hr on 14.6 cm H2O with 100% compliance in using more than 4 hours nightly.  The patient has been using and benefiting from PAP use and will continue to benefit from therapy. His machine broke and has is using an loaner and now needs a new device.  I will order him a new device through [facility].  He will followup with me in 10 weeks for insurance requirements.    2.  Obesity -I have encouraged him to get into a routine exercise program and cut back on carbs and portions. He had neck surgery and has not been exercising but wants to get back into exercise.   3.  HTN -continue Lisinopril 10mg  daily   COVID-19 Education: The signs and symptoms of COVID-19 were discussed with the patient and how to seek care for testing (follow up with PCP or arrange E-visit).  The importance of social distancing was discussed today.  Patient Risk:   After full review of this patient's clinical status, I feel that they are at least moderate risk at this time.  Time:   Today, I have spent 20 minutes directly with the patient on telemedicine discussing medical problems including OSA, HTN, obesity.  We also reviewed the symptoms of COVID 19 and the ways to protect against contracting the virus with telehealth technology.  I spent an additional 5 minutes reviewing patient's chart including PAP compliance download.  Medication Adjustments/Labs and Tests Ordered: Current medicines are reviewed at length with the patient today.  Concerns regarding medicines are outlined above.  Tests Ordered: No orders of the defined types were placed in this encounter.  Medication Changes: No orders of the defined types  were placed in this encounter.   Disposition:  Follow up 10 weeks after getting a device Signed, , MD  12/28/2019 8:14 AM    [facility]

## 2020-01-01 ENCOUNTER — Telehealth: Payer: Self-pay | Admitting: *Deleted

## 2020-01-01 DIAGNOSIS — G4733 Obstructive sleep apnea (adult) (pediatric): Secondary | ICD-10-CM

## 2020-01-01 NOTE — Telephone Encounter (Signed)
Patient has a 10 week follow up appointment scheduled for 02/07/20 AT 8AM. Patient understands he needs to keep this appointment for insurance compliance. Patient was grateful for the call and thanked me.  

## 2020-01-01 NOTE — Telephone Encounter (Signed)
-----   Message from Quintella Reichert, MD sent at 12/28/2019  8:20 AM EST ----- Please order a ResMed CPAP 14.6cm H2O with heated humidity and mask of choice and will need followup with me virtual in 10 weeks

## 2020-01-01 NOTE — Addendum Note (Signed)
Addended by: Reesa Chew on: 01/01/2020 05:25 PM   Modules accepted: Orders

## 2020-01-01 NOTE — Telephone Encounter (Signed)
Patient has a 10 week follow up appointment scheduled for 02/07/20 AT 8AM. Patient understands he needs to keep this appointment for insurance compliance. Patient was grateful for the call and thanked me.

## 2020-01-01 NOTE — Telephone Encounter (Signed)
Patient called to say his cpap came on Saturday 12/29/19. Upon patient request DME selection is [facility] Patient understands he will be contacted by [facility] to set up his cpap. Patient understands to call if [facility] does not contact him with new setup in a timely manner. Patient understands they will be called once confirmation has been received from [facility] that they have received their new machine to schedule 10 week follow up appointment.  [facility] notified of new cpap order  Please add to airview Patient was grateful for the call and thanked me.

## 2020-01-01 NOTE — Telephone Encounter (Signed)
Patient has a 10 week follow up appointment scheduled for  °Patient understands she needs to keep this appointment for insurance compliance. °Patient was grateful for the call and thanked me.  ° °

## 2020-01-01 NOTE — Telephone Encounter (Signed)

## 2020-01-02 DIAGNOSIS — R350 Frequency of micturition: Secondary | ICD-10-CM | POA: Diagnosis not present

## 2020-01-02 DIAGNOSIS — N451 Epididymitis: Secondary | ICD-10-CM | POA: Diagnosis not present

## 2020-01-10 ENCOUNTER — Encounter: Payer: Self-pay | Admitting: Internal Medicine

## 2020-01-10 ENCOUNTER — Telehealth (INDEPENDENT_AMBULATORY_CARE_PROVIDER_SITE_OTHER): Payer: BC Managed Care – PPO | Admitting: Internal Medicine

## 2020-01-10 VITALS — BP 125/80 | HR 80 | Ht 71.0 in | Wt 210.0 lb

## 2020-01-10 DIAGNOSIS — I10 Essential (primary) hypertension: Secondary | ICD-10-CM

## 2020-01-10 DIAGNOSIS — E781 Pure hyperglyceridemia: Secondary | ICD-10-CM | POA: Diagnosis not present

## 2020-01-10 DIAGNOSIS — R0789 Other chest pain: Secondary | ICD-10-CM

## 2020-01-10 DIAGNOSIS — E669 Obesity, unspecified: Secondary | ICD-10-CM

## 2020-01-10 DIAGNOSIS — G4733 Obstructive sleep apnea (adult) (pediatric): Secondary | ICD-10-CM

## 2020-01-10 DIAGNOSIS — Z8249 Family history of ischemic heart disease and other diseases of the circulatory system: Secondary | ICD-10-CM

## 2020-01-10 MED ORDER — TRILIPIX 135 MG PO CPDR: 135.0000 mg | DELAYED_RELEASE_CAPSULE | Freq: Every evening | ORAL | 3 refills | Status: DC

## 2020-01-10 NOTE — Progress Notes (Signed)
Virtual Visit via Video Note   This visit type was conducted due to national recommendations for restrictions regarding the COVID-19 Pandemic (e.g. social distancing) in an effort to limit this patient's exposure and mitigate transmission in our community.  Due to his co-morbid illnesses, this patient is at least at moderate risk for complications without adequate follow up.  This format is felt to be most appropriate for this patient at this time.  All issues noted in this document were discussed and addressed.  A limited physical exam was performed with this format.  Please refer to the patient's chart for his consent to telehealth for [facility].   Evaluation Performed:  Telephone visit  Date:  01/10/2020   ID:  Allen Thomas, DOB 02-Jul-1966, MRN [id number]  Patient Location:  [address] [place] [address]  Provider location:   [address] [place], [place] [address]  PCP:  Quintella Reichert, MD  Cardiologist:  Chrystie Nose, MD Electrophysiologist:  None   Chief Complaint:  High triglycerides  History of Present Illness:    Allen Thomas is a 54 y.o. male who presents via audio/video conferencing for a telehealth visit today.  Allen Thomas is a pleasant 54 year old male who was formally followed by [facility] cardiology with a history of dyslipidemia and early onset coronary disease.  He also has type 2 diabetes, mildly elevated liver enzymes, depression/anxiety on Paxil, and obesity which she attributes to the antidepressant medication.  It was previously followed by [employer], Pharm.D. at the [facility] lipid clinic.  In 2012 he underwent heart catheterization by Dr. Deborah Chalk which showed no significant obstructive coronary disease.  Subsequently he was followed by Nada Boozer and Dr. Mayford Knife and had coronary calcium screening in 2016.  This was scored as 0 with no evidence of coronary disease.  He also has obstructive sleep apnea and has been on CPAP.  He is  currently on high intensity rosuvastatin, Lovaza, and fenofibrate.  His most recent lipid profile was on May 28, 2019.  Total cholesterol was 138, HDL 28, LDL 62 and triglycerides 413.  Hemoglobin A1c was 7.4.  TSH was 1.49.  He reports a reasonably healthy diet however during the COVID pandemic says that he is eaten more saturated foods and not been as strict about following carbohydrates.  01/10/2020  Allen Thomas is seen today via telephone visit.  He reports feeling better.  He has had problems with his neck and recently was in the emergency department this past September with chest pain.  Ultimately was thought to be coming from his neck.  He has had surgery.  He says he is getting better.  Unfortunately he has been more sedentary.  He reports being compliant with his medications including the Vascepa on top of his fenofibrate and rosuvastatin.  Labs 2 weeks ago showed total cholesterol 127, triglycerides 532, HDL 24 and LDL of 30 (NIH calculated)-his direct LDL was 40.  He reports his glycemic control has not been optimal either.  His liver enzymes are relatively normal with mild ALT elevation.  The patient does not have symptoms concerning for COVID-19 infection (fever, chills, cough, or new SHORTNESS OF BREATH).    Prior CV studies:   The following studies were reviewed today:  Chart review, lab work  PMHx:  Past Medical History:  Diagnosis Date  . Anxiety   . Depression   . Diabetes mellitus    Type II  . Elevated LFTs  Secondary to fatty liver  . History of tobacco abuse   . Hypertension   . Mixed dyslipidemia   . Obesity   . Psoriasis    followed by Dermatologist Dr Ronnald Ramp  . Sleep apnea    settings at 11   . Tinnitus 2009   followed by ENT Dr Constance Holster    Past Surgical History:  Procedure Laterality Date  . BACK SURGERY     L5-S1 back surgery   . CARDIAC CATHETERIZATION  2004   normal coronary arteries  . KNEE ARTHROSCOPY  07/27/2012   Procedure: ARTHROSCOPY KNEE;   Surgeon: Mcarthur Rossetti, MD;  Location: [facility];  Service: Orthopedics;  Laterality: Left;  Left knee arthroscopy with debridement and medial menisectomy    FAMHx:  Family History  Problem Relation Age of Onset  . CAD Mother   . Stroke Brother     SOCHx:   reports that he quit smoking about 16 years ago. He has quit using smokeless tobacco. He reports current alcohol use. He reports that he does not use drugs.  ALLERGIES:  No Known Allergies  MEDS:  Current Meds  Medication Sig  . betamethasone dipropionate (DIPROLENE) 0.05 % ointment Apply 1 application topically daily.  . Choline Fenofibrate (TRILIPIX) 135 MG capsule Take 135 mg by mouth every evening.  Marland Kitchen Coal Tar Extract (PSORIASIN) 1.25 % GEL Apply topically as needed. For psoriasis on elbows  . glimepiride (AMARYL) 2 MG tablet Take 1 tablet by mouth daily.  Vanessa Kick Ethyl 1 g CAPS Take 2 capsules (2 g total) by mouth 2 (two) times daily.  Marland Kitchen lisinopril (PRINIVIL,ZESTRIL) 10 MG tablet Take 10 mg by mouth every morning.   Marland Kitchen LORazepam (ATIVAN) 1 MG tablet Take 0.25 mg by mouth 3 (three) times daily.   . metaxalone (SKELAXIN) 800 MG tablet Take 800 mg by mouth 3 (three) times daily as needed for muscle spasms.  . metFORMIN (GLUCOPHAGE) 500 MG tablet Take 1,000 mg by mouth 2 (two) times daily with a meal.   . Multiple Vitamin (MULTIVITAMIN) tablet Take 1 tablet by mouth daily.  Marland Kitchen PARoxetine (PAXIL) 40 MG tablet Take 40 mg by mouth every evening.   . rosuvastatin (CRESTOR) 40 MG tablet Take 40 mg by mouth every evening.     ROS: Pertinent items noted in HPI and remainder of comprehensive ROS otherwise negative.  Labs/Other Tests and Data Reviewed:    Recent Labs: 09/07/2019: BUN 14; Creatinine, Ser 0.68; Hemoglobin 13.5; Platelets 314; Potassium 3.8; Sodium 139 12/21/2019: ALT 51   Recent Lipid Panel Lab Results  Component Value Date/Time   CHOL 127 12/21/2019 09:22 AM   TRIG 532 (H) 12/21/2019 09:22 AM   HDL 24  (L) 12/21/2019 09:22 AM   CHOLHDL 5.3 (H) 12/21/2019 09:22 AM   LDLCALC 30 12/21/2019 09:22 AM   LDLDIRECT 40 12/21/2019 09:22 AM    Wt Readings from Last 3 Encounters:  01/10/20 210 lb (95.3 kg)  12/28/19 206 lb (93.4 kg)  09/07/19 215 lb (97.5 kg)     Exam:    Vital Signs:  BP 125/80   Pulse 80   Ht 5\' 11"  (1.803 m)   Wt 210 lb (95.3 kg)   BMI 29.29 kg/m    Exam not performed due to telephone visit  ASSESSMENT & PLAN:    1. Mixed dyslipidemia with high triglycerides 2. No evidence of coronary disease by cardiac cath (2012) 3. CAC score of 0 (2016) 4. Type 2 diabetes-A1c 7.4 5. OSA  on CPAP 6. Essential hypertension  Allen Thomas continues to have elevated triglycerides however LDL is at target.  He is on Vascepa and fenofibrate as well as rosuvastatin.  He has been more sedentary and his diet is not optimal.  His blood sugars have also been elevated.  He needs to continue to work on better glycemic control, more physical activity and dietary interventions.  We will continue current medications.  COVID-19 Education: The signs and symptoms of COVID-19 were discussed with the patient and how to seek care for testing (follow up with PCP or arrange E-visit).  The importance of social distancing was discussed today.  Patient Risk:   After full review of this patients clinical status, I feel that they are at least moderate risk at this time.  Time:   Today, I have spent 25 minutes with the patient with telehealth technology discussing dyslipidemia, elevated triglycerides, diabetes, dietary recommendations, cardiac procedures.     Medication Adjustments/Labs and Tests Ordered: Current medicines are reviewed at length with the patient today.  Concerns regarding medicines are outlined above.   Tests Ordered: No orders of the defined types were placed in this encounter.   Medication Changes: No orders of the defined types were placed in this encounter.   Disposition:  in 6  month(s)  Chrystie Nose, MD, [facility], FACP  [place]  [facility]  Medical Director of the Advanced Lipid Disorders &  Cardiovascular Risk Reduction Clinic Diplomate of the American Board of Clinical Lipidology Attending Cardiologist  Direct Dial: [phone]  Fax: [phone]  Website:  [email or web address]  Chrystie Nose, MD  01/10/2020 8:02 AM

## 2020-01-10 NOTE — Addendum Note (Signed)
Addended by: ELKINS, JENNA M on: 11/19/2020 08:21 AM   Modules accepted: Orders  

## 2020-01-10 NOTE — Patient Instructions (Signed)
Medication Instructions:  Your physician recommends that you continue on your current medications as directed. Please refer to the Current Medication list given to you today.  *If you need a refill on your cardiac medications before your next appointment, please call your pharmacy*  Lab Work: FASTING lab work in 6 months to check cholesterol  If you have labs (blood work) drawn today and your tests are completely normal, you will receive your results only by: Marland Kitchen MyChart Message (if you have MyChart) OR . A paper copy in the mail If you have any lab test that is abnormal or we need to change your treatment, we will call you to review the results.  Testing/Procedures: NONE  Follow-Up: At [facility], you and your health needs are our priority.  As part of our continuing mission to provide you with exceptional heart care, we have created designated Provider Care Teams.  These Care Teams include your primary Cardiologist (physician) and Advanced Practice Providers (APPs -  Physician Assistants and Nurse Practitioners) who all work together to provide you with the care you need, when you need it.  Your next appointment:   6 month(s)  The format for your next appointment:   Either In Person or Virtual  Provider:   Kirtland Bouchard Italy Hilty, MD

## 2020-01-11 ENCOUNTER — Telehealth: Payer: Self-pay | Admitting: Internal Medicine

## 2020-01-11 NOTE — Telephone Encounter (Signed)
PA for vascepa submitted via [facility] [employer]  (Key: [id number])

## 2020-01-14 NOTE — Telephone Encounter (Signed)
vascepa approved 01/11/2020 - 01/10/2023

## 2020-01-27 DIAGNOSIS — G4733 Obstructive sleep apnea (adult) (pediatric): Secondary | ICD-10-CM | POA: Diagnosis not present

## 2020-02-07 ENCOUNTER — Telehealth (INDEPENDENT_AMBULATORY_CARE_PROVIDER_SITE_OTHER): Payer: BC Managed Care – PPO | Admitting: Cardiology

## 2020-02-07 ENCOUNTER — Other Ambulatory Visit: Payer: Self-pay

## 2020-02-07 ENCOUNTER — Encounter: Payer: Self-pay | Admitting: Cardiology

## 2020-02-07 VITALS — BP 118/75 | Ht 71.0 in | Wt 212.0 lb

## 2020-02-07 DIAGNOSIS — G4733 Obstructive sleep apnea (adult) (pediatric): Secondary | ICD-10-CM

## 2020-02-07 DIAGNOSIS — E669 Obesity, unspecified: Secondary | ICD-10-CM

## 2020-02-07 DIAGNOSIS — I10 Essential (primary) hypertension: Secondary | ICD-10-CM

## 2020-02-07 NOTE — Patient Instructions (Signed)
Medication Instructions:  Your physician recommends that you continue on your current medications as directed. Please refer to the Current Medication list given to you today.  *If you need a refill on your cardiac medications before your next appointment, please call your pharmacy*   Follow-Up: At [facility], you and your health needs are our priority.  As part of our continuing mission to provide you with exceptional heart care, we have created designated Provider Care Teams.  These Care Teams include your primary Cardiologist (physician) and Advanced Practice Providers (APPs -  Physician Assistants and Nurse Practitioners) who all work together to provide you with the care you need, when you need it.  Your next appointment:   1 year(s)  The format for your next appointment:   Either In Person or Virtual  Provider:   Armanda Magic, MD or Everette Rank, MD

## 2020-02-07 NOTE — Progress Notes (Signed)
Virtual Visit via Telephone Note   This visit type was conducted due to national recommendations for restrictions regarding the COVID-19 Pandemic (e.g. social distancing) in an effort to limit this patient's exposure and mitigate transmission in our community.  Due to his co-morbid illnesses, this patient is at least at moderate risk for complications without adequate follow up.  This format is felt to be most appropriate for this patient at this time.  The patient did not have access to video technology/had technical difficulties with video requiring transitioning to audio format only (telephone).  All issues noted in this document were discussed and addressed.  No physical exam could be performed with this format.  Please refer to the patient's chart for his  consent to telehealth for [facility].   Evaluation Performed:  Follow-up visit  This visit type was conducted due to national recommendations for restrictions regarding the COVID-19 Pandemic (e.g. social distancing).  This format is felt to be most appropriate for this patient at this time.  All issues noted in this document were discussed and addressed.  No physical exam was performed (except for noted visual exam findings with Video Visits).  Please refer to the patient's chart (MyChart message for video visits and phone note for telephone visits) for the patient's consent to telehealth for [facility].  Date:  02/07/2020   ID:  Allen Thomas, DOB Aug 19, 1966, MRN [id number]  Patient Location:  Home  Provider location:   [place]  PCP:  Sueanne Margarita, MD  Cardiologist:  Pixie Casino, MD  Sleep Medicine:  Fransico Him, MD Electrophysiologist:  None   Chief Complaint:  OSA  History of Present Illness:    Allen Thomas is a 54 y.o. male who presents via audio/video conferencing for a telehealth visit today.    Allen F Johnsonis a 53y.o.malewho presents for followup of OSA. He has a history of OSA and has been on  CPAP for some time. When I saw him last his PAP device broke and he was given a new one and is now here for followup to document compliance with his new device as required by insurance.  He is doing well with his new CPAP device and thinks that he has gotten used to it.  He tolerates the full face mask and feels the pressure is adequate.  Since going on CPAP he feels rested in the am for the most part if his dog does not wake him up and has no significant daytime sleepiness.  He has some mild   mouth dryness.  He does not think that he snores.    The patient does not have symptoms concerning for COVID-19 infection (fever, chills, cough, or new shortness of breath).   Prior CV studies:   The following studies were reviewed today:  PAP compliance download on [employer]  Past Medical History:  Diagnosis Date  . Anxiety   . Depression   . Diabetes mellitus    Type II  . Elevated LFTs    Secondary to fatty liver  . History of tobacco abuse   . Hypertension   . Mixed dyslipidemia   . Obesity   . Psoriasis    followed by Dermatologist Dr Ronnald Ramp  . Sleep apnea    settings at 11   . Tinnitus 2009   followed by ENT Dr Constance Holster   Past Surgical History:  Procedure Laterality Date  . BACK SURGERY     L5-S1 back surgery   . CARDIAC  CATHETERIZATION  2004   normal coronary arteries  . KNEE ARTHROSCOPY  07/27/2012   Procedure: ARTHROSCOPY KNEE;  Surgeon: Kathryne Hitch, MD;  Location: [facility];  Service: Orthopedics;  Laterality: Left;  Left knee arthroscopy with debridement and medial menisectomy     Current Meds  Medication Sig  . betamethasone dipropionate (DIPROLENE) 0.05 % ointment Apply 1 application topically daily.  . Choline Fenofibrate (TRILIPIX) 135 MG capsule Take 1 capsule (135 mg total) by mouth every evening.  Marland Kitchen glimepiride (AMARYL) 2 MG tablet Take 1 tablet by mouth daily.  Bess Harvest Ethyl 1 g CAPS Take 2 capsules (2 g total) by mouth 2 (two) times daily.  Marland Kitchen lisinopril  (PRINIVIL,ZESTRIL) 10 MG tablet Take 10 mg by mouth every morning.   Marland Kitchen LORazepam (ATIVAN) 1 MG tablet Take 0.25 mg by mouth 3 (three) times daily.   . metaxalone (SKELAXIN) 800 MG tablet Take 800 mg by mouth 3 (three) times daily as needed for muscle spasms.  . metFORMIN (GLUCOPHAGE) 500 MG tablet Take 1,000 mg by mouth 2 (two) times daily with a meal.   . Multiple Vitamin (MULTIVITAMIN) tablet Take 1 tablet by mouth daily.  Marland Kitchen PARoxetine (PAXIL) 40 MG tablet Take 40 mg by mouth every evening.   . rosuvastatin (CRESTOR) 40 MG tablet Take 40 mg by mouth every evening.     Allergies:   Patient has no known allergies.   Social History   Tobacco Use  . Smoking status: Former Smoker    Quit date: 12/14/2003    Years since quitting: 16.1  . Smokeless tobacco: Former Engineer, water Use Topics  . Alcohol use: Yes    Comment: rare  . Drug use: No     Family Hx: The patient's family history includes CAD in his mother; Stroke in his brother.  ROS:   Please see the history of present illness.     All other systems reviewed and are negative.   Labs/Other Tests and Data Reviewed:    Recent Labs: 09/07/2019: BUN 14; Creatinine, Ser 0.68; Hemoglobin 13.5; Platelets 314; Potassium 3.8; Sodium 139 12/21/2019: ALT 51   Recent Lipid Panel Lab Results  Component Value Date/Time   CHOL 127 12/21/2019 09:22 AM   TRIG 532 (H) 12/21/2019 09:22 AM   HDL 24 (L) 12/21/2019 09:22 AM   CHOLHDL 5.3 (H) 12/21/2019 09:22 AM   LDLCALC 30 12/21/2019 09:22 AM   LDLDIRECT 40 12/21/2019 09:22 AM    Wt Readings from Last 3 Encounters:  02/07/20 212 lb (96.2 kg)  01/10/20 210 lb (95.3 kg)  12/28/19 206 lb (93.4 kg)     Objective:    Vital Signs:  BP 118/75   Ht 5\' 11"  (1.803 m)   Wt 212 lb (96.2 kg)   BMI 29.57 kg/m     ASSESSMENT & PLAN:    1.  OSA -  The patient is tolerating PAP therapy well without any problems. The PAP download was reviewed today and showed an AHI of 2.5/hr on 14.6 cm H2O  with 100% compliance in using more than 4 hours nightly.  The patient has been using and benefiting from PAP use and will continue to benefit from therapy.   2.  HTN -BP controlled -continue Lisinopril 10mg  daily  3.  Obesity --he has bought an elliptical and is exercising and watching his diet    COVID-19 Education: The signs and symptoms of COVID-19 were discussed with the patient and how to seek care for testing (  follow up with PCP or arrange E-visit).  The importance of social distancing was discussed today.  Patient Risk:   After full review of this patient's clinical status, I feel that they are at least moderate risk at this time.  Time:   Today, I have spent 15 minutes on telemedicine discussing medical problems including OSA< HTN, obesity and reviewing patient's chart including PAP compliance on airview.  Medication Adjustments/Labs and Tests Ordered: Current medicines are reviewed at length with the patient today.  Concerns regarding medicines are outlined above.  Tests Ordered: No orders of the defined types were placed in this encounter.  Medication Changes: No orders of the defined types were placed in this encounter.   Disposition:  Follow up in 1 year(s)  Signed, Armanda Magic, MD  02/07/2020 7:57 AM    [facility]

## 2020-02-24 DIAGNOSIS — G4733 Obstructive sleep apnea (adult) (pediatric): Secondary | ICD-10-CM | POA: Diagnosis not present

## 2020-02-28 DIAGNOSIS — M5412 Radiculopathy, cervical region: Secondary | ICD-10-CM | POA: Diagnosis not present

## 2020-02-28 DIAGNOSIS — Z6830 Body mass index (BMI) 30.0-30.9, adult: Secondary | ICD-10-CM | POA: Diagnosis not present

## 2020-02-28 DIAGNOSIS — I10 Essential (primary) hypertension: Secondary | ICD-10-CM | POA: Diagnosis not present

## 2020-03-24 DIAGNOSIS — M5020 Other cervical disc displacement, unspecified cervical region: Secondary | ICD-10-CM | POA: Diagnosis not present

## 2020-03-24 DIAGNOSIS — M5412 Radiculopathy, cervical region: Secondary | ICD-10-CM | POA: Diagnosis not present

## 2020-03-26 DIAGNOSIS — G4733 Obstructive sleep apnea (adult) (pediatric): Secondary | ICD-10-CM | POA: Diagnosis not present

## 2020-04-01 DIAGNOSIS — Z6830 Body mass index (BMI) 30.0-30.9, adult: Secondary | ICD-10-CM | POA: Diagnosis not present

## 2020-04-01 DIAGNOSIS — I10 Essential (primary) hypertension: Secondary | ICD-10-CM | POA: Diagnosis not present

## 2020-04-01 DIAGNOSIS — M5412 Radiculopathy, cervical region: Secondary | ICD-10-CM | POA: Diagnosis not present

## 2020-05-13 ENCOUNTER — Other Ambulatory Visit: Payer: Self-pay | Admitting: Internal Medicine

## 2020-05-13 DIAGNOSIS — E782 Mixed hyperlipidemia: Secondary | ICD-10-CM

## 2020-05-13 DIAGNOSIS — E781 Pure hyperglyceridemia: Secondary | ICD-10-CM

## 2020-05-26 ENCOUNTER — Telehealth: Payer: Self-pay | Admitting: Internal Medicine

## 2020-05-26 NOTE — Telephone Encounter (Signed)
I attempted to contact patient on 05/26/20 to schedule follow up visit with Dr. Rennis Golden from patients recall list. The patient didn't answer so I left message for patient to return call.

## 2020-06-19 DIAGNOSIS — F419 Anxiety disorder, unspecified: Secondary | ICD-10-CM | POA: Diagnosis not present

## 2020-06-19 DIAGNOSIS — E1169 Type 2 diabetes mellitus with other specified complication: Secondary | ICD-10-CM | POA: Diagnosis not present

## 2020-06-19 DIAGNOSIS — E781 Pure hyperglyceridemia: Secondary | ICD-10-CM | POA: Diagnosis not present

## 2020-06-19 DIAGNOSIS — Z125 Encounter for screening for malignant neoplasm of prostate: Secondary | ICD-10-CM | POA: Diagnosis not present

## 2020-06-19 DIAGNOSIS — Z0000 Encounter for general adult medical examination without abnormal findings: Secondary | ICD-10-CM | POA: Diagnosis not present

## 2020-06-27 DIAGNOSIS — H9201 Otalgia, right ear: Secondary | ICD-10-CM | POA: Diagnosis not present

## 2020-06-27 DIAGNOSIS — J029 Acute pharyngitis, unspecified: Secondary | ICD-10-CM | POA: Diagnosis not present

## 2020-08-04 DIAGNOSIS — R748 Abnormal levels of other serum enzymes: Secondary | ICD-10-CM | POA: Diagnosis not present

## 2020-08-04 DIAGNOSIS — E782 Mixed hyperlipidemia: Secondary | ICD-10-CM | POA: Diagnosis not present

## 2020-08-04 DIAGNOSIS — E781 Pure hyperglyceridemia: Secondary | ICD-10-CM | POA: Diagnosis not present

## 2020-08-04 LAB — LDL CHOLESTEROL, DIRECT: LDL Direct: 67 mg/dL (ref 0–99)

## 2020-08-04 LAB — LIPID PANEL
Chol/HDL Ratio: 5.9 ratio — ABNORMAL HIGH (ref 0.0–5.0)
Cholesterol, Total: 159 mg/dL (ref 100–199)
HDL: 27 mg/dL — ABNORMAL LOW
LDL Chol Calc (NIH): 74 mg/dL (ref 0–99)
Triglycerides: 364 mg/dL — ABNORMAL HIGH (ref 0–149)
VLDL Cholesterol Cal: 58 mg/dL — ABNORMAL HIGH (ref 5–40)

## 2020-08-08 ENCOUNTER — Other Ambulatory Visit: Payer: Self-pay | Admitting: Internal Medicine

## 2020-08-08 DIAGNOSIS — E782 Mixed hyperlipidemia: Secondary | ICD-10-CM

## 2020-08-11 ENCOUNTER — Encounter: Payer: Self-pay | Admitting: Internal Medicine

## 2020-08-11 ENCOUNTER — Telehealth (INDEPENDENT_AMBULATORY_CARE_PROVIDER_SITE_OTHER): Payer: BC Managed Care – PPO | Admitting: Internal Medicine

## 2020-08-11 VITALS — Ht 71.0 in | Wt 211.0 lb

## 2020-08-11 DIAGNOSIS — E119 Type 2 diabetes mellitus without complications: Secondary | ICD-10-CM

## 2020-08-11 DIAGNOSIS — E669 Obesity, unspecified: Secondary | ICD-10-CM | POA: Diagnosis not present

## 2020-08-11 DIAGNOSIS — E782 Mixed hyperlipidemia: Secondary | ICD-10-CM

## 2020-08-11 DIAGNOSIS — E781 Pure hyperglyceridemia: Secondary | ICD-10-CM

## 2020-08-11 NOTE — Progress Notes (Signed)
Virtual Visit via Video Note   This visit type was conducted due to national recommendations for restrictions regarding the COVID-19 Pandemic (e.g. social distancing) in an effort to limit this patient's exposure and mitigate transmission in our community.  Due to his co-morbid illnesses, this patient is at least at moderate risk for complications without adequate follow up.  This format is felt to be most appropriate for this patient at this time.  All issues noted in this document were discussed and addressed.  A limited physical exam was performed with this format.  Please refer to the patient's chart for his consent to telehealth for [facility].   Evaluation Performed:  Telephone visit  Date:  08/11/2020   ID:  Allen Thomas, DOB 02-11-1966, MRN [id number]  Patient Location:  [address] [place] [address]  Provider location:   [address], [address] [place], [place] [address]  PCP:  Quintella Reichert, MD  Cardiologist:  Chrystie Nose, MD Electrophysiologist:  None   Chief Complaint:  High triglycerides  History of Present Illness:    Allen Thomas is a 54 y.o. male who presents via audio/video conferencing for a telehealth visit today.  Allen Thomas is a pleasant 54 year old male who was formally followed by [facility] cardiology with a history of dyslipidemia and early onset coronary disease.  He also has type 2 diabetes, mildly elevated liver enzymes, depression/anxiety on Paxil, and obesity which she attributes to the antidepressant medication.  It was previously followed by [employer], Pharm.D. at the [facility] lipid clinic.  In 2012 he underwent heart catheterization by Dr. Deborah Chalk which showed no significant obstructive coronary disease.  Subsequently he was followed by Nada Boozer and Dr. Mayford Knife and had coronary calcium screening in 2016.  This was scored as 0 with no evidence of coronary disease.  He also has obstructive sleep apnea and has been on CPAP.  He is  currently on high intensity rosuvastatin, Lovaza, and fenofibrate.  His most recent lipid profile was on May 28, 2019.  Total cholesterol was 138, HDL 28, LDL 62 and triglycerides 413.  Hemoglobin A1c was 7.4.  TSH was 1.49.  He reports a reasonably healthy diet however during the COVID pandemic says that he is eaten more saturated foods and not been as strict about following carbohydrates.  01/10/2020  Allen Thomas is seen today via telephone visit.  He reports feeling better.  He has had problems with his neck and recently was in the emergency department this past September with chest pain.  Ultimately was thought to be coming from his neck.  He has had surgery.  He says he is getting better.  Unfortunately he has been more sedentary.  He reports being compliant with his medications including the Vascepa on top of his fenofibrate and rosuvastatin.  Labs 2 weeks ago showed total cholesterol 127, triglycerides 532, HDL 24 and LDL of 30 (NIH calculated)-his direct LDL was 40.  He reports his glycemic control has not been optimal either.  His liver enzymes are relatively normal with mild ALT elevation.  08/11/2020  Allen Thomas seems to be doing well. He denies any pancreatitis. He had recent labs which show an improvement in his triglycerides from 532 down to 364. Total cholesterol is 159 with an LDL 74. He remains compliant with high potency rosuvastatin, Vascepa 2 g twice daily and fenofibrate. He recently had an increase in his A1c over 8.0 and was placed on Actos. This is helped his  blood sugars and his triglycerides.  The patient does not have symptoms concerning for COVID-19 infection (fever, chills, cough, or new SHORTNESS OF BREATH).    Prior CV studies:   The following studies were reviewed today:  Chart review, lab work  PMHx:  Past Medical History:  Diagnosis Date   Anxiety    Depression    Diabetes mellitus    Type II   Elevated LFTs    Secondary to fatty liver   History of  tobacco abuse    Hypertension    Mixed dyslipidemia    Obesity    Psoriasis    followed by Dermatologist Dr Yetta Barre   Sleep apnea    settings at 11    Tinnitus 2009   followed by ENT Dr Pollyann Kennedy    Past Surgical History:  Procedure Laterality Date   BACK SURGERY     L5-S1 back surgery    CARDIAC CATHETERIZATION  2004   normal coronary arteries   KNEE ARTHROSCOPY  07/27/2012   Procedure: ARTHROSCOPY KNEE;  Surgeon: Kathryne Hitch, MD;  Location: [facility];  Service: Orthopedics;  Laterality: Left;  Left knee arthroscopy with debridement and medial menisectomy    FAMHx:  Family History  Problem Relation Age of Onset   CAD Mother    Stroke Brother     SOCHx:   reports that he quit smoking about 16 years ago. He has quit using smokeless tobacco. He reports current alcohol use. He reports that he does not use drugs.  ALLERGIES:  No Known Allergies  MEDS:  Current Meds  Medication Sig   betamethasone dipropionate (DIPROLENE) 0.05 % ointment Apply 1 application topically daily.   Choline Fenofibrate (TRILIPIX) 135 MG capsule Take 1 capsule (135 mg total) by mouth every evening.   Coal Tar Extract (PSORIASIN) 1.25 % GEL Apply topically as needed. For psoriasis on elbows   glimepiride (AMARYL) 2 MG tablet Take 1 tablet by mouth daily.   lisinopril (PRINIVIL,ZESTRIL) 10 MG tablet Take 10 mg by mouth every morning.    LORazepam (ATIVAN) 1 MG tablet Take 0.25 mg by mouth 3 (three) times daily.    metaxalone (SKELAXIN) 800 MG tablet Take 800 mg by mouth 3 (three) times daily as needed for muscle spasms.   metFORMIN (GLUCOPHAGE) 500 MG tablet Take 1,000 mg by mouth 2 (two) times daily with a meal.    Multiple Vitamin (MULTIVITAMIN) tablet Take 1 tablet by mouth daily.   PARoxetine (PAXIL) 40 MG tablet Take 40 mg by mouth every evening.    pioglitazone (ACTOS) 15 MG tablet Take 15 mg by mouth daily.   rosuvastatin (CRESTOR) 40 MG tablet Take 40 mg by mouth  every evening.   VASCEPA 1 g capsule TAKE 2 CAPSULES (2 G TOTAL) BY MOUTH 2 (TWO) TIMES DAILY.     ROS: Pertinent items noted in HPI and remainder of comprehensive ROS otherwise negative.  Labs/Other Tests and Data Reviewed:    Recent Labs: 09/07/2019: BUN 14; Creatinine, Ser 0.68; Hemoglobin 13.5; Platelets 314; Potassium 3.8; Sodium 139 08/04/2020: ALT 56   Recent Lipid Panel Lab Results  Component Value Date/Time   CHOL 159 08/04/2020 09:10 AM   TRIG 364 (H) 08/04/2020 09:10 AM   HDL 27 (L) 08/04/2020 09:10 AM   CHOLHDL 5.9 (H) 08/04/2020 09:10 AM   LDLCALC 74 08/04/2020 09:10 AM   LDLDIRECT 67 08/04/2020 09:10 AM    Wt Readings from Last 3 Encounters:  08/11/20 211 lb (95.7 kg)  02/07/20 212  lb (96.2 kg)  01/10/20 210 lb (95.3 kg)     Exam:    Vital Signs:  Ht 5\' 11"  (1.803 m)    Wt 211 lb (95.7 kg)    BMI 29.43 kg/m    Exam not performed due to telephone visit  ASSESSMENT & PLAN:    1. Mixed dyslipidemia with high triglycerides 2. No evidence of coronary disease by cardiac cath (2012) 3. CAC score of 0 (2016) 4. Type 2 diabetes-A1c 7.4 5. OSA on CPAP 6. Essential hypertension  Mr. Swoveland did have interim improvement in his triglycerides with better glycemic control. He is now on Actos. Triglycerides are still in the 300s however there is really few other options for him at this point. There is an apo-C3 inhibitor that we are trialing for patients with familial chylomicronemia syndrome. I doubt he will qualify for that however there is an add-on trial which does not require genetic confirmation of this rare disorder and would be open to more patients with elevated triglycerides. He may qualify for this. Plan follow-up with me in 1 year or sooner as necessary.  COVID-19 Education: The signs and symptoms of COVID-19 were discussed with the patient and how to seek care for testing (follow up with PCP or arrange E-visit).  The importance of social distancing was  discussed today.  Patient Risk:   After full review of this patients clinical status, I feel that they are at least moderate risk at this time.  Time:   Today, I have spent 15 minutes with the patient with telehealth technology discussing dyslipidemia, elevated triglycerides, diabetes, dietary recommendations, cardiac procedures.     Medication Adjustments/Labs and Tests Ordered: Current medicines are reviewed at length with the patient today.  Concerns regarding medicines are outlined above.   Tests Ordered: No orders of the defined types were placed in this encounter.   Medication Changes: No orders of the defined types were placed in this encounter.   Disposition:  in 1 year(s)  Laural Benes, MD, [facility], FACP  [place]   [facility]  Medical Director of the Advanced Lipid Disorders &  Cardiovascular Risk Reduction Clinic Diplomate of the American Board of Clinical Lipidology Attending Cardiologist  Direct Dial: [phone]   Fax: [phone]  Website:  [email or web address]  [phone], MD  08/11/2020 8:06 AM

## 2020-08-11 NOTE — Patient Instructions (Signed)
Medication Instructions:  Your physician recommends that you continue on your current medications as directed. Please refer to the Current Medication list given to you today.  *If you need a refill on your cardiac medications before your next appointment, please call your pharmacy*  Lab Work: NONE   Testing/Procedures: NONE  Follow-Up: At [facility], you and your health needs are our priority.  As part of our continuing mission to provide you with exceptional heart care, we have created designated Provider Care Teams.  These Care Teams include your primary Cardiologist (physician) and Advanced Practice Providers (APPs -  Physician Assistants and Nurse Practitioners) who all work together to provide you with the care you need, when you need it.  We recommend signing up for the patient portal called "MyChart".  Sign up information is provided on this After Visit Summary.  MyChart is used to connect with patients for Virtual Visits (Telemedicine).  Patients are able to view lab/test results, encounter notes, upcoming appointments, etc.  Non-urgent messages can be sent to your provider as well.   To learn more about what you can do with MyChart, go to [email or web address].    Your next appointment:   12 month(s)  You will receive a reminder letter in the mail two months in advance. If you don't receive a letter, please call our office to schedule the follow-up appointment.  The format for your next appointment:   In Person  Provider:   You may see Chrystie Nose, MD or one of the following Advanced Practice Providers on your designated Care Team:    Azalee Course, PA-C  Micah Flesher, PA-C or   Judy Pimple, [place]

## 2020-08-28 LAB — HEPATIC FUNCTION PANEL
ALT: 56 IU/L — ABNORMAL HIGH (ref 0–44)
AST: 57 IU/L — ABNORMAL HIGH (ref 0–40)
Albumin: 4.9 g/dL (ref 3.8–4.9)
Alkaline Phosphatase: 65 IU/L (ref 48–121)
Bilirubin Total: 0.2 mg/dL (ref 0.0–1.2)
Bilirubin, Direct: 0.07 mg/dL (ref 0.00–0.40)
Total Protein: 7.2 g/dL (ref 6.0–8.5)

## 2020-08-28 LAB — SPECIMEN STATUS REPORT

## 2020-09-06 DIAGNOSIS — E119 Type 2 diabetes mellitus without complications: Secondary | ICD-10-CM | POA: Diagnosis not present

## 2020-09-17 DIAGNOSIS — E781 Pure hyperglyceridemia: Secondary | ICD-10-CM | POA: Diagnosis not present

## 2020-09-17 DIAGNOSIS — K7689 Other specified diseases of liver: Secondary | ICD-10-CM | POA: Diagnosis not present

## 2020-09-17 DIAGNOSIS — E1169 Type 2 diabetes mellitus with other specified complication: Secondary | ICD-10-CM | POA: Diagnosis not present

## 2020-10-09 DIAGNOSIS — G4733 Obstructive sleep apnea (adult) (pediatric): Secondary | ICD-10-CM | POA: Diagnosis not present

## 2021-01-28 DIAGNOSIS — G4733 Obstructive sleep apnea (adult) (pediatric): Secondary | ICD-10-CM | POA: Diagnosis not present

## 2021-02-06 ENCOUNTER — Other Ambulatory Visit: Payer: Self-pay | Admitting: Internal Medicine

## 2021-02-06 DIAGNOSIS — E1169 Type 2 diabetes mellitus with other specified complication: Secondary | ICD-10-CM | POA: Diagnosis not present

## 2021-02-06 DIAGNOSIS — F410 Panic disorder [episodic paroxysmal anxiety] without agoraphobia: Secondary | ICD-10-CM | POA: Diagnosis not present

## 2021-02-11 ENCOUNTER — Telehealth: Payer: Self-pay | Admitting: Internal Medicine

## 2021-02-11 NOTE — Telephone Encounter (Signed)
PA for choline fenofibrate submitted via CMM Key: [id number] - Rx #: [id number]

## 2021-02-12 NOTE — Telephone Encounter (Signed)
Choline fenofibrate 135mg  denied GENERIC FENOFIBRATE134MG  OR 145MG  PREFERRED PRODUCT (OR SERVICE NOT COVERED). P  Sent to MD regarding denial/to change therapy

## 2021-02-12 NOTE — Telephone Encounter (Signed)
Ok to switch to generic 145 fenofibrate  Dr Rexene Edison

## 2021-02-13 ENCOUNTER — Telehealth (INDEPENDENT_AMBULATORY_CARE_PROVIDER_SITE_OTHER): Payer: BC Managed Care – PPO | Admitting: Cardiology

## 2021-02-13 ENCOUNTER — Other Ambulatory Visit: Payer: Self-pay

## 2021-02-13 ENCOUNTER — Encounter: Payer: Self-pay | Admitting: Cardiology

## 2021-02-13 VITALS — BP 133/77 | HR 82 | Ht 71.0 in | Wt 210.0 lb

## 2021-02-13 DIAGNOSIS — G4733 Obstructive sleep apnea (adult) (pediatric): Secondary | ICD-10-CM | POA: Diagnosis not present

## 2021-02-13 DIAGNOSIS — I10 Essential (primary) hypertension: Secondary | ICD-10-CM

## 2021-02-13 NOTE — Patient Instructions (Signed)
Medication Instructions:  Your provider recommends that you continue on your current medications as directed. Please refer to the Current Medication list given to you today.   *If you need a refill on your cardiac medications before your next appointment, please call your pharmacy*  Follow-Up: At [facility], you and your health needs are our priority.  As part of our continuing mission to provide you with exceptional heart care, we have created designated Provider Care Teams.  These Care Teams include your primary Cardiologist (physician) and Advanced Practice Providers (APPs -  Physician Assistants and Nurse Practitioners) who all work together to provide you with the care you need, when you need it. Your next appointment:   12 month(s) Provider:   Armanda Magic, MD

## 2021-02-13 NOTE — Progress Notes (Signed)
Virtual Visit via Video Note   This visit type was conducted due to national recommendations for restrictions regarding the COVID-19 Pandemic (e.g. social distancing) in an effort to limit this patient's exposure and mitigate transmission in our community.  Due to his co-morbid illnesses, this patient is at least at moderate risk for complications without adequate follow up.  This format is felt to be most appropriate for this patient at this time.  The patient did not have access to video technology/had technical difficulties with video requiring transitioning to audio format only (telephone).  All issues noted in this document were discussed and addressed.  No physical exam could be performed with this format.  Please refer to the patient's chart for his  consent to telehealth for [facility].   Evaluation Performed:  Follow-up visit  This visit type was conducted due to national recommendations for restrictions regarding the COVID-19 Pandemic (e.g. social distancing).  This format is felt to be most appropriate for this patient at this time.  All issues noted in this document were discussed and addressed.  No physical exam was performed (except for noted visual exam findings with Video Visits).  Please refer to the patient's chart (MyChart message for video visits and phone note for telephone visits) for the patient's consent to telehealth for [facility].  Date:  02/13/2021   ID:  Allen Thomas, DOB [birth year], MRN [id number]  Patient Location:  Home  Provider location:   [place]  PCP:  Quintella Reichert, MD  Cardiologist:  Chrystie Nose, MD  Sleep Medicine:  Armanda Magic, MD Electrophysiologist:  None   Chief Complaint:  OSA  History of Present Illness:    Allen Thomas is a 55 y.o. male who presents via audio/video conferencing for a telehealth visit today.    Allen F Allen Thomas a 55y.o.malewho presents for followup of OSA. He is doing well with his CPAP device and  thinks that he has gotten used to it.  He tolerates the mask and feels the pressure is adequate.  Since going on CPAP he feels rested in the am and has no significant daytime sleepiness if he sleeps well the night before but has a dog that wakes him up at night.  He denies any significant mouth or nasal dryness or nasal congestion.  He does not think that he snores.    The patient does not have symptoms concerning for COVID-19 infection (fever, chills, cough, or new shortness of breath).   Prior CV studies:   The following studies were reviewed today:  PAP compliance download on Airview  Past Medical History:  Diagnosis Date  . Anxiety   . Depression   . Diabetes mellitus    Type II  . Elevated LFTs    Secondary to fatty liver  . History of tobacco abuse   . Hypertension   . Mixed dyslipidemia   . Obesity   . Psoriasis    followed by Dermatologist Dr Yetta Barre  . Sleep apnea    settings at 11   . Tinnitus 2009   followed by ENT Dr Pollyann Kennedy   Past Surgical History:  Procedure Laterality Date  . BACK SURGERY     L5-S1 back surgery   . CARDIAC CATHETERIZATION  2004   normal coronary arteries  . KNEE ARTHROSCOPY  07/27/2012   Procedure: ARTHROSCOPY KNEE;  Surgeon: Kathryne Hitch, MD;  Location: [facility];  Service: Orthopedics;  Laterality: Left;  Left knee arthroscopy with debridement and medial  menisectomy     Current Meds  Medication Sig  . betamethasone dipropionate (DIPROLENE) 0.05 % ointment Apply 1 application topically daily.  . Choline Fenofibrate (FENOFIBRIC ACID) 135 MG CPDR TAKE 1 CAPSULE (135 MG TOTAL) BY MOUTH EVERY EVENING.  Marland Kitchen Coal Tar Extract 1.25 % GEL Apply topically as needed. For psoriasis on elbows  . JARDIANCE 10 MG TABS tablet Take 10 mg by mouth daily.  Marland Kitchen lisinopril (PRINIVIL,ZESTRIL) 10 MG tablet Take 10 mg by mouth every morning.  Marland Kitchen LORazepam (ATIVAN) 1 MG tablet Take 0.25 mg by mouth 3 (three) times daily.   . metaxalone (SKELAXIN) 800 MG tablet Take  800 mg by mouth 3 (three) times daily as needed for muscle spasms.  . metFORMIN (GLUCOPHAGE) 500 MG tablet Take 1,000 mg by mouth 2 (two) times daily with a meal.   . Multiple Vitamin (MULTIVITAMIN) tablet Take 1 tablet by mouth daily.  Marland Kitchen PARoxetine (PAXIL) 40 MG tablet Take 40 mg by mouth every evening.   . pioglitazone (ACTOS) 15 MG tablet Take 15 mg by mouth daily.  . rosuvastatin (CRESTOR) 40 MG tablet Take 40 mg by mouth every evening.  Marland Kitchen VASCEPA 1 g capsule TAKE 2 CAPSULES (2 G TOTAL) BY MOUTH 2 (TWO) TIMES DAILY.     Allergies:   Patient has no known allergies.   Social History   Tobacco Use  . Smoking status: Former Smoker    Quit date: 12/14/2003    Years since quitting: 17.1  . Smokeless tobacco: Former Engineer, water Use Topics  . Alcohol use: Yes    Comment: rare  . Drug use: No     Family Hx: The patient's family history includes CAD in his mother; Stroke in his brother.  ROS:   Please see the history of present illness.     All other systems reviewed and are negative.   Labs/Other Tests and Data Reviewed:    Recent Labs: 08/04/2020: ALT 56   Recent Lipid Panel Lab Results  Component Value Date/Time   CHOL 159 08/04/2020 09:10 AM   TRIG 364 (H) 08/04/2020 09:10 AM   HDL 27 (L) 08/04/2020 09:10 AM   CHOLHDL 5.9 (H) 08/04/2020 09:10 AM   LDLCALC 74 08/04/2020 09:10 AM   LDLDIRECT 67 08/04/2020 09:10 AM    Wt Readings from Last 3 Encounters:  02/13/21 210 lb (95.3 kg)  08/11/20 211 lb (95.7 kg)  02/07/20 212 lb (96.2 kg)     Objective:    Vital Signs:  BP 133/77   Pulse 82   Ht 5\' 11"  (1.803 m)   Wt 210 lb (95.3 kg)   BMI 29.29 kg/m    Well nourished, well developed male in no acute distress. Well appearing, alert and conversant, regular work of breathing,  good skin color  Eyes- anicteric mouth- oral mucosa is pink  neuro- grossly intact skin- no apparent rash or lesions or cyanosis  ASSESSMENT & PLAN:    1.  OSA -  The PAP download  was reviewed today and showed an AHI of 3.3/hr on auto PAP with 100% compliance in using more than 4 hours nightly.  The patient has been using and benefiting from PAP use and will continue to benefit from therapy.   2.  HTN -BP is adqeuately controlled on e -continue Lisinopril 10mg  daily  3.  Obesity -I encouraged him to try to get back walking and watching his diet    COVID-19 Education: The signs and symptoms of COVID-19 were  discussed with the patient and how to seek care for testing (follow up with PCP or arrange E-visit).  The importance of social distancing was discussed today.  Patient Risk:   After full review of this patient's clinical status, I feel that they are at least moderate risk at this time.  Time:   Today, I have spent 20 minutes on telemedicine discussing medical problems including OSA< HTN, obesity and reviewing patient's chart including PAP compliance on airview.  Medication Adjustments/Labs and Tests Ordered: Current medicines are reviewed at length with the patient today.  Concerns regarding medicines are outlined above.  Tests Ordered: No orders of the defined types were placed in this encounter.  Medication Changes: No orders of the defined types were placed in this encounter.   Disposition:  Follow up in 1 year(s)  Signed, Armanda Magic, MD  02/13/2021 8:38 AM    [facility]

## 2021-02-13 NOTE — Telephone Encounter (Signed)
Patient has 2 insurance plans - he said the pharmacy ran his current Rx with the other plan and it went thru

## 2021-03-02 ENCOUNTER — Other Ambulatory Visit: Payer: Self-pay | Admitting: Internal Medicine

## 2021-03-15 ENCOUNTER — Other Ambulatory Visit: Payer: Self-pay | Admitting: Internal Medicine

## 2021-03-19 ENCOUNTER — Telehealth: Payer: Self-pay | Admitting: *Deleted

## 2021-03-19 DIAGNOSIS — G4733 Obstructive sleep apnea (adult) (pediatric): Secondary | ICD-10-CM

## 2021-03-19 NOTE — Telephone Encounter (Signed)
Order placed to [employer] [facility] to Change to nasal pillow mask with chin strap and get a download in 4 weeks

## 2021-04-15 ENCOUNTER — Other Ambulatory Visit: Payer: Self-pay | Admitting: Internal Medicine

## 2021-04-29 DIAGNOSIS — G4733 Obstructive sleep apnea (adult) (pediatric): Secondary | ICD-10-CM | POA: Diagnosis not present

## 2021-05-08 DIAGNOSIS — M79671 Pain in right foot: Secondary | ICD-10-CM | POA: Diagnosis not present

## 2021-05-18 DIAGNOSIS — E1169 Type 2 diabetes mellitus with other specified complication: Secondary | ICD-10-CM | POA: Diagnosis not present

## 2021-05-18 DIAGNOSIS — U071 COVID-19: Secondary | ICD-10-CM | POA: Diagnosis not present

## 2021-07-02 DIAGNOSIS — E1169 Type 2 diabetes mellitus with other specified complication: Secondary | ICD-10-CM | POA: Diagnosis not present

## 2021-07-02 DIAGNOSIS — U071 COVID-19: Secondary | ICD-10-CM | POA: Diagnosis not present

## 2021-07-06 DIAGNOSIS — Z0000 Encounter for general adult medical examination without abnormal findings: Secondary | ICD-10-CM | POA: Diagnosis not present

## 2021-07-06 DIAGNOSIS — K760 Fatty (change of) liver, not elsewhere classified: Secondary | ICD-10-CM | POA: Diagnosis not present

## 2021-07-06 DIAGNOSIS — E1169 Type 2 diabetes mellitus with other specified complication: Secondary | ICD-10-CM | POA: Diagnosis not present

## 2021-07-06 DIAGNOSIS — I10 Essential (primary) hypertension: Secondary | ICD-10-CM | POA: Diagnosis not present

## 2021-07-06 DIAGNOSIS — F338 Other recurrent depressive disorders: Secondary | ICD-10-CM | POA: Diagnosis not present

## 2021-07-15 ENCOUNTER — Encounter (HOSPITAL_BASED_OUTPATIENT_CLINIC_OR_DEPARTMENT_OTHER): Payer: Self-pay

## 2021-08-01 ENCOUNTER — Other Ambulatory Visit: Payer: Self-pay | Admitting: Internal Medicine

## 2021-08-01 DIAGNOSIS — E782 Mixed hyperlipidemia: Secondary | ICD-10-CM

## 2021-08-12 ENCOUNTER — Other Ambulatory Visit: Payer: Self-pay | Admitting: Internal Medicine

## 2021-09-05 ENCOUNTER — Other Ambulatory Visit: Payer: Self-pay | Admitting: Internal Medicine

## 2021-09-05 DIAGNOSIS — E782 Mixed hyperlipidemia: Secondary | ICD-10-CM

## 2021-10-06 ENCOUNTER — Other Ambulatory Visit: Payer: Self-pay | Admitting: Internal Medicine

## 2021-10-06 DIAGNOSIS — E782 Mixed hyperlipidemia: Secondary | ICD-10-CM

## 2021-11-27 ENCOUNTER — Other Ambulatory Visit: Payer: Self-pay | Admitting: Internal Medicine

## 2021-11-27 DIAGNOSIS — E782 Mixed hyperlipidemia: Secondary | ICD-10-CM

## 2021-12-15 DIAGNOSIS — E782 Mixed hyperlipidemia: Secondary | ICD-10-CM | POA: Diagnosis not present

## 2021-12-15 DIAGNOSIS — K760 Fatty (change of) liver, not elsewhere classified: Secondary | ICD-10-CM | POA: Diagnosis not present

## 2021-12-15 DIAGNOSIS — E1169 Type 2 diabetes mellitus with other specified complication: Secondary | ICD-10-CM | POA: Diagnosis not present

## 2021-12-29 ENCOUNTER — Encounter: Payer: Self-pay | Admitting: Cardiology

## 2022-01-06 DIAGNOSIS — E1169 Type 2 diabetes mellitus with other specified complication: Secondary | ICD-10-CM | POA: Diagnosis not present

## 2022-01-06 DIAGNOSIS — K760 Fatty (change of) liver, not elsewhere classified: Secondary | ICD-10-CM | POA: Diagnosis not present

## 2022-01-06 DIAGNOSIS — F419 Anxiety disorder, unspecified: Secondary | ICD-10-CM | POA: Diagnosis not present

## 2022-01-06 DIAGNOSIS — I10 Essential (primary) hypertension: Secondary | ICD-10-CM | POA: Diagnosis not present

## 2022-01-12 DIAGNOSIS — E119 Type 2 diabetes mellitus without complications: Secondary | ICD-10-CM | POA: Diagnosis not present

## 2022-03-01 ENCOUNTER — Telehealth: Payer: Self-pay | Admitting: Internal Medicine

## 2022-03-01 ENCOUNTER — Other Ambulatory Visit: Payer: Self-pay

## 2022-03-01 ENCOUNTER — Encounter: Payer: Self-pay | Admitting: Internal Medicine

## 2022-03-01 ENCOUNTER — Ambulatory Visit (INDEPENDENT_AMBULATORY_CARE_PROVIDER_SITE_OTHER): Payer: BC Managed Care – PPO | Admitting: Internal Medicine

## 2022-03-01 VITALS — BP 110/72 | HR 84 | Ht 71.0 in | Wt 196.6 lb

## 2022-03-01 DIAGNOSIS — E781 Pure hyperglyceridemia: Secondary | ICD-10-CM

## 2022-03-01 DIAGNOSIS — I10 Essential (primary) hypertension: Secondary | ICD-10-CM | POA: Diagnosis not present

## 2022-03-01 DIAGNOSIS — E782 Mixed hyperlipidemia: Secondary | ICD-10-CM

## 2022-03-01 DIAGNOSIS — E119 Type 2 diabetes mellitus without complications: Secondary | ICD-10-CM | POA: Diagnosis not present

## 2022-03-01 NOTE — Telephone Encounter (Signed)
Genetic test for dyslipidemia/ASCVD ordered ([facility]) ?Cheek swab completed in office ?Specimen and necessary paperwork mailed. ?ID: [id number] ? ?

## 2022-03-01 NOTE — Patient Instructions (Signed)
Medication Instructions:  ?NO CHANGES ? ?*If you need a refill on your cardiac medications before your next appointment, please call your pharmacy* ? ? ?Lab Work: ?FASTING lab work in about 6 months  ?-- complete about 1 week before next visit  ? ?If you have labs (blood work) drawn today and your tests are completely normal, you will receive your results only by: ?MyChart Message (if you have MyChart) OR ?A paper copy in the mail ?If you have any lab test that is abnormal or we need to change your treatment, we will call you to review the results. ? ? ?Testing/Procedures: ?Genetic Test ?-- results available in about 3 weeks ? ? ?Follow-Up: ?At [facility], you and your health needs are our priority.  As part of our continuing mission to provide you with exceptional heart care, we have created designated Provider Care Teams.  These Care Teams include your primary Cardiologist (physician) and Advanced Practice Providers (APPs -  Physician Assistants and Nurse Practitioners) who all work together to provide you with the care you need, when you need it. ? ?We recommend signing up for the patient portal called "MyChart".  Sign up information is provided on this After Visit Summary.  MyChart is used to connect with patients for Virtual Visits (Telemedicine).  Patients are able to view lab/test results, encounter notes, upcoming appointments, etc.  Non-urgent messages can be sent to your provider as well.   ?To learn more about what you can do with MyChart, go to [email or web address].   ? ?Your next appointment:   ?6 month(s) ? ?The format for your next appointment:   ?In Person ? ?Provider:   ?Pixie Casino, MD { ? ? ?

## 2022-03-01 NOTE — Progress Notes (Signed)
? ? ?LIPID CLINIC CONSULT NOTE ? ?Chief Complaint:  ?Follow-up dyslipidemia ? ?Primary Care Physician: ?Sueanne Margarita, MD ? ?Primary Cardiologist:  ?Pixie Casino, MD ? ?HPI:  ?Allen Thomas is a 56 y.o. male who is being seen today for the evaluation of dyslipidemia at the request of Turner, Eber Hong, MD. Allen Thomas is a pleasant 56 year old male who was formally followed by [facility] cardiology with a history of dyslipidemia and early onset coronary disease.  He also has type 2 diabetes, mildly elevated liver enzymes, depression/anxiety on Paxil, and obesity which she attributes to the antidepressant medication.  It was previously followed by [employer], Pharm.D. at the [facility] lipid clinic.  In 2012 he underwent heart catheterization by Dr. Doreatha Lew which showed no significant obstructive coronary disease.  Subsequently he was followed by Cecilie Kicks and Dr. Radford Pax and had coronary calcium screening in 2016.  This was scored as 0 with no evidence of coronary disease.  He also has obstructive sleep apnea and has been on CPAP.  He is currently on high intensity rosuvastatin, Lovaza, and fenofibrate.  His most recent lipid profile was on May 28, 2019.  Total cholesterol was 138, HDL 28, LDL 62 and triglycerides 413.  Hemoglobin A1c was 7.4.  TSH was 1.49.  He reports a reasonably healthy diet however during the COVID pandemic says that he is eaten more saturated foods and not been as strict about following carbohydrates. ? ?01/10/2020 ? ?Allen Thomas is seen today via telephone visit.  He reports feeling better.  He has had problems with his neck and recently was in the emergency department this past September with chest pain.  Ultimately was thought to be coming from his neck.  He has had surgery.  He says he is getting better.  Unfortunately he has been more sedentary.  He reports being compliant with his medications including the Vascepa on top of his fenofibrate and rosuvastatin.  Labs 2 weeks ago showed total  cholesterol 127, triglycerides 532, HDL 24 and LDL of 30 (NIH calculated)-his direct LDL was 40.  He reports his glycemic control has not been optimal either.  His liver enzymes are relatively normal with mild ALT elevation. ? ?08/11/2020 ? ?Allen Thomas seems to be doing well. He denies any pancreatitis. He had recent labs which show an improvement in his triglycerides from 532 down to 364. Total cholesterol is 159 with an LDL 74. He remains compliant with high potency rosuvastatin, Vascepa 2 g twice daily and fenofibrate. He recently had an increase in his A1c over 8.0 and was placed on Actos. This is helped his blood sugars and his triglycerides. ? ?03/01/2022 ? ?Allen Thomas is seen today for the first time in the office.  His visits previously been virtual.  He seems to be doing well.  His cholesterol however did go up somewhat over the holidays.  Labs in January showed total cholesterol of 181, triglycerides 562, HDL 34 and hemoglobin A1c was 6.7%.  He reported this may have been dietary over the holidays.  He continues to be compliant with his rosuvastatin 40 mg daily, Vascepa 2 g twice daily and fenofibrate 135 mg daily.  He is also now on 25 mg Jardiance in addition to metformin and Actos.  He reports his activity level was less however is recently picked up and his fasting blood sugars have improved. ? ?PMHx:  ?Past Medical History:  ?Diagnosis Date  ? Anxiety   ? Depression   ? Diabetes mellitus   ?  Type II  ? Elevated LFTs   ? Secondary to fatty liver  ? History of tobacco abuse   ? Hypertension   ? Mixed dyslipidemia   ? Obesity   ? Psoriasis   ? followed by Dermatologist Dr Ronnald Ramp  ? Sleep apnea   ? settings at 11   ? Tinnitus 2009  ? followed by ENT Dr Constance Holster  ? ? ?Past Surgical History:  ?Procedure Laterality Date  ? BACK SURGERY    ? L5-S1 back surgery   ? CARDIAC CATHETERIZATION  2004  ? normal coronary arteries  ? KNEE ARTHROSCOPY  07/27/2012  ? Procedure: ARTHROSCOPY KNEE;  Surgeon: Mcarthur Rossetti, MD;  Location: [facility];  Service: Orthopedics;  Laterality: Left;  Left knee arthroscopy with debridement and medial menisectomy  ? ? ?FAMHx:  ?Family History  ?Problem Relation Age of Onset  ? CAD Mother   ? Stroke Brother   ? ? ?SOCHx:  ? reports that he quit smoking about 18 years ago. His smoking use included cigarettes. He has quit using smokeless tobacco. He reports current alcohol use. He reports that he does not use drugs. ? ?ALLERGIES:  ?No Known Allergies ? ?ROS: ?Pertinent items noted in HPI and remainder of comprehensive ROS otherwise negative. ? ?HOME MEDS: ?Current Outpatient Medications on File Prior to Visit  ?Medication Sig Dispense Refill  ? betamethasone dipropionate (DIPROLENE) 0.05 % ointment Apply 1 application topically daily.    ? Choline Fenofibrate (FENOFIBRIC ACID) 135 MG CPDR TAKE 1 CAPSULE (135 MG TOTAL) BY MOUTH EVERY EVENING. 90 capsule 1  ? Coal Tar Extract 1.25 % GEL Apply topically as needed. For psoriasis on elbows    ? hydrocortisone 2.5 % cream Apply topically 2 (two) times daily.    ? JARDIANCE 25 MG TABS tablet Take 25 mg by mouth daily.    ? lisinopril (ZESTRIL) 20 MG tablet Take 20 mg by mouth daily.    ? LORazepam (ATIVAN) 1 MG tablet Take 0.25 mg by mouth 3 (three) times daily.   0  ? metaxalone (SKELAXIN) 800 MG tablet Take 800 mg by mouth 3 (three) times daily as needed for muscle spasms.    ? metFORMIN (GLUCOPHAGE) 500 MG tablet Take 1,000 mg by mouth 2 (two) times daily with a meal.     ? Multiple Vitamin (MULTIVITAMIN) tablet Take 1 tablet by mouth daily.    ? PARoxetine (PAXIL) 40 MG tablet Take 40 mg by mouth every evening.     ? pioglitazone (ACTOS) 15 MG tablet TAKE 1 TABLET BY MOUTH EVERY DAY 90 tablet 3  ? rosuvastatin (CRESTOR) 40 MG tablet TAKE 1 TABLET BY MOUTH EVERY DAY 90 tablet 1  ? VASCEPA 1 g capsule TAKE 2 CAPSULES BY MOUTH TWICE A DAY 120 capsule 3  ? ?No current facility-administered medications on file prior to visit.  ? ? ?LABS/IMAGING: ?No  results found for this or any previous visit (from the past 48 hour(s)). ?No results found. ? ?LIPID PANEL: ?   ?Component Value Date/Time  ? CHOL 159 08/04/2020 0910  ? TRIG 364 (H) 08/04/2020 0910  ? HDL 27 (L) 08/04/2020 0910  ? CHOLHDL 5.9 (H) 08/04/2020 0910  ? [place] 74 08/04/2020 0910  ? LDLDIRECT 67 08/04/2020 0910  ? ? ?WEIGHTS: ?Wt Readings from Last 3 Encounters:  ?03/01/22 196 lb 9.6 oz (89.2 kg)  ?02/13/21 210 lb (95.3 kg)  ?08/11/20 211 lb (95.7 kg)  ? ? ?VITALS: ?BP 110/72   Pulse 84  Ht 5\' 11"  (1.803 m)   Wt 196 lb 9.6 oz (89.2 kg)   SpO2 98%   BMI 27.42 kg/m?  ? ?EXAM: ?deferred ? ?EKG: ?deferred ? ?ASSESSMENT: ?Mixed dyslipidemia with high triglycerides ?No evidence of coronary disease by cardiac cath (2012) ?CAC score of 0 (2016) ?Type 2 diabetes-A1c 7.4 ?OSA on CPAP ?Essential hypertension ? ?PLAN: ?1.   Allen Thomas has had an increase in his triglycerides.  There may be a dietary component to this.  He is not working on that more aggressively.  His weight is come down more and he is more active.  He is interested in genetic reasons for his high cholesterol noted his father had MI in his 73s.  This is a significant risk factor for him.  Although he had no coronary disease by cath in 2012 and no coronary calcium in 2016, this is reassuring.  I think we can continue to work with diet and his current medical regimen.  We will go ahead and check his genetic risk today.  Ultimately, if he persists he might be a candidate for clinical trial however a genetic triglyceride disorder could exclude him. ? ?Follow-up with me in 6 months or sooner as necessary. ? ?Pixie Casino, MD, [facility], FACP  ?[place]  ?Medical Director of the Advanced Lipid Disorders &  ?Cardiovascular Risk Reduction Clinic ?Diplomate of the [employer] of Clinical Lipidology ?Attending Cardiologist  ?Direct Dial: [phone]  Fax: [phone]  ?Website:  [email or web address] ? ?Nadean Corwin  Bedford Winsor ?03/01/2022, 1:11 PM ?

## 2022-03-05 ENCOUNTER — Other Ambulatory Visit: Payer: Self-pay | Admitting: Internal Medicine

## 2022-03-19 ENCOUNTER — Encounter: Payer: Self-pay | Admitting: Internal Medicine

## 2022-03-25 ENCOUNTER — Other Ambulatory Visit: Payer: Self-pay | Admitting: Internal Medicine

## 2022-03-25 DIAGNOSIS — E782 Mixed hyperlipidemia: Secondary | ICD-10-CM

## 2022-03-26 ENCOUNTER — Encounter: Payer: Self-pay | Admitting: Cardiology

## 2022-03-27 ENCOUNTER — Telehealth: Payer: Self-pay | Admitting: *Deleted

## 2022-03-27 DIAGNOSIS — G4733 Obstructive sleep apnea (adult) (pediatric): Secondary | ICD-10-CM

## 2022-03-27 NOTE — Telephone Encounter (Signed)
Hi. I have lost about 30 lbs. I wanted to know if it is possible to have a sleep study or is there some way to see if my cpap needs adjustment. I find myself pulling the mask off in my sleep more and more  Thanks.  ? ? ?PER DR tURNER, ?Please get an Itamar sleep study with patient not using CPAP that night to see if he still has OSA  ?

## 2022-04-01 DIAGNOSIS — M542 Cervicalgia: Secondary | ICD-10-CM | POA: Diagnosis not present

## 2022-04-01 DIAGNOSIS — Z6826 Body mass index (BMI) 26.0-26.9, adult: Secondary | ICD-10-CM | POA: Diagnosis not present

## 2022-04-01 DIAGNOSIS — M545 Low back pain, unspecified: Secondary | ICD-10-CM | POA: Diagnosis not present

## 2022-04-05 NOTE — Telephone Encounter (Signed)
Prior Authorization for [facility] sent to [facility] via web portal. Order ID: [id number] ?Authorized ?Approval Valid Through:04/05/2022 - 06/03/2022 ?

## 2022-04-06 NOTE — Telephone Encounter (Signed)
Prior Authorization for [facility] sent to [facility] via web portal.  ?do not require Pre-Authorization by [employer] ?

## 2022-04-06 NOTE — Telephone Encounter (Signed)
I s/w the pt today and he is agreeable to plan of care for Allen Thomas Sleep Study. Pt will come by the office May 1st 4 pm for set up. Pt is already approved so he will be given the Pin# [phone] when he comes in.  ?

## 2022-04-09 DIAGNOSIS — M6281 Muscle weakness (generalized): Secondary | ICD-10-CM | POA: Diagnosis not present

## 2022-04-09 DIAGNOSIS — M542 Cervicalgia: Secondary | ICD-10-CM | POA: Diagnosis not present

## 2022-04-09 DIAGNOSIS — M2560 Stiffness of unspecified joint, not elsewhere classified: Secondary | ICD-10-CM | POA: Diagnosis not present

## 2022-04-09 DIAGNOSIS — M545 Low back pain, unspecified: Secondary | ICD-10-CM | POA: Diagnosis not present

## 2022-04-12 NOTE — Telephone Encounter (Signed)
Sleep Apnea Evaluation ? ?[facility] ? ?Today's Date: 04/12/2022  ? ?Patient Name: Allen Thomas        ?DOB: 1966-04-08  ?     ?Height:        ?Weight:    ?BMI: There is no height or weight on file to calculate BMI.  ?  ?Referring Provider:   ?  ?STOP-BANG RISK ASSESSMENT    ?   ? ? ?If STOP-BANG Score ?3 OR two clinical symptoms - patient qualifies for WatchPAT (CPT 95800)     ? ?Sleep study ordered due to two (2) of the following clinical symptoms/diagnoses:  ?Excessive daytime sleepiness G47.10  Gastroesophageal reflux K21.9  Nocturia R35.1  Morning Headaches G44.221  Difficulty concentrating R41.840  Memory problems or poor judgment G31.84  Personality changes or irritability R45.4  Loud snoring R06.83  Depression F32.9  Unrefreshed by sleep G47.8  Impotence N52.9  History of high blood pressure R03.0  Insomnia G47.00  Sleep Disordered Breathing or Sleep Apnea ICD G47.33   ? ? ?

## 2022-04-12 NOTE — Telephone Encounter (Signed)
Pt was set up with Itamar study today. Pt has already been approved. Pt has reviewed the instructional tutorial while in the office. Pt aware if study not done in the next 30 days as he has been given PIN # [id number] today he will be charged $100. Pt verbalized understanding to plan of care.   ?

## 2022-04-13 ENCOUNTER — Encounter: Payer: Self-pay | Admitting: Cardiology

## 2022-04-13 ENCOUNTER — Encounter (INDEPENDENT_AMBULATORY_CARE_PROVIDER_SITE_OTHER): Payer: BC Managed Care – PPO | Admitting: Cardiology

## 2022-04-13 DIAGNOSIS — M2560 Stiffness of unspecified joint, not elsewhere classified: Secondary | ICD-10-CM | POA: Diagnosis not present

## 2022-04-13 DIAGNOSIS — M542 Cervicalgia: Secondary | ICD-10-CM | POA: Diagnosis not present

## 2022-04-13 DIAGNOSIS — M6281 Muscle weakness (generalized): Secondary | ICD-10-CM | POA: Diagnosis not present

## 2022-04-13 DIAGNOSIS — M545 Low back pain, unspecified: Secondary | ICD-10-CM | POA: Diagnosis not present

## 2022-04-13 DIAGNOSIS — G4733 Obstructive sleep apnea (adult) (pediatric): Secondary | ICD-10-CM

## 2022-04-14 NOTE — Procedures (Signed)
? ?  SLEEP STUDY REPORT ?Patient Information ?Study Date: 04/13/22 ?Patient Name: Allen Thomas ?Patient ID: [id number] ?Birth Date: Jul 11, 2066 ?Age: 56 ?Gender: Male ?BMI: 27.5 (W=196 lb, H=5' 11'') ?Neck Circ.: 16 '' ?Referring Physician: Armanda Magic, MD ? ?TEST DESCRIPTION: Home sleep apnea testing was completed using the WatchPat, a Type 1 device, utilizing ?peripheral arterial tonometry (PAT), chest movement, actigraphy, pulse oximetry, pulse rate, body position and snore. ?AHI was calculated with apnea and hypopnea using valid sleep time as the denominator. RDI includes apneas, ?hypopneas, and RERAs. The data acquired and the scoring of sleep and all associated events were performed in ?accordance with the recommended standards and specifications as outlined in the AASM Manual for the Scoring of ?Sleep and Associated Events 2.2.0 (2015). ? ?FINDINGS: ?1. Moderate Obstructive Sleep Apnea with AHI 20.3/hr. ?2. No significant Central Sleep Apnea with pAHIc 4.2/hr. ?3. Oxygen desaturations as low as 82%. ?4. Severe snoring was present. O2 sats were < 88% for 3.4 min. ?5. Total sleep time was 5 hrs and 16 min. ?6. 17.4% of total sleep time was spent in REM sleep. ?7. Normal sleep onset latency at 19 min ?8. Shortened REM sleep onset latency at 55 min. ?9. Total awakenings were 20. ? ?DIAGNOSIS: ?Moderate Obstructive Sleep Apnea (G47.33) ? ?RECOMMENDATIONS: ?1. Clinical correlation of these findings is necessary. The decision to treat obstructive sleep apnea (OSA) is usually ?based on the presence of apnea symptoms or the presence of associated medical conditions such as Hypertension, ?Congestive Heart Failure, Atrial Fibrillation or Obesity. The most common symptoms of OSA are snoring, gasping for ?breath while sleeping, daytime sleepiness and fatigue. ? ?2. Initiating apnea therapy is recommended given the presence of symptoms and/or associated conditions. ?Recommend proceeding with one of the following: ? ? a.  Auto-CPAP therapy with a pressure range of 5-20cm H2O. ? ? b. An oral appliance (OA) that can be obtained from certain dentists with expertise in sleep medicine. These are ?primarily of use in non-obese patients with mild and moderate disease.   ? ? c. An ENT consultation which may be useful to look for specific causes of obstruction and possible treatment ?options. ? ? d. If patient is intolerant to PAP therapy, consider referral to ENT for evaluation for hypoglossal nerve stimulator. ? ?3. Close follow-up is necessary to ensure success with CPAP or oral appliance therapy for maximum benefit . ? ?4. A follow-up oximetry study on CPAP is recommended to assess the adequacy of therapy and determine the need ?for supplemental oxygen or the potential need for Bi-level therapy. An arterial blood gas to determine the adequacy of ?baseline ventilation and oxygenation should also be considered. ? ?5. Healthy sleep recommendations include: adequate nightly sleep (normal 7-9 hrs/night), avoidance of caffeine after ?noon and alcohol near bedtime, and maintaining a sleep environment that is cool, dark and quiet. ? ?6. Weight loss for overweight patients is recommended. Even modest amounts of weight loss can significantly ?improve the severity of sleep apnea. ? ?7. Snoring recommendations include: weight loss where appropriate, side sleeping, and avoidance of alcohol before ?bed. ? ?8. Operation of motor vehicle should not be performed when sleepy. ? ?Signature: ?Electronically Signed: 04/15/22 ?Armanda Magic, MD; [facility]; Diplomat, American Board of Sleep Medicine ? ?

## 2022-04-15 ENCOUNTER — Telehealth: Payer: Self-pay | Admitting: *Deleted

## 2022-04-15 ENCOUNTER — Ambulatory Visit: Payer: BC Managed Care – PPO

## 2022-04-15 NOTE — Telephone Encounter (Signed)
-----   Message from Gaynelle Cage, CMA sent at 04/15/2022  8:40 AM EDT ----- ? ?----- Message ----- ?From: Quintella Reichert, MD ?Sent: 04/14/2022  10:46 PM EDT ?To: [facility] Sleep Studies ? ?Please let patient know that they have sleep apnea and recommend treating with CPAP.  Please order an auto CPAP from 4-15cm H2O with heated humidity and mask of choice.  Order overnight pulse ox on CPAP.  Followup with me in 6 weeks.  ?  ? ?

## 2022-04-15 NOTE — Telephone Encounter (Signed)
The patient has been notified of the result and verbalized understanding.  All questions (if any) were answered. ?Latrelle Dodrill, CMA 04/15/2022 10:25 AM   ? ? ?

## 2022-04-20 DIAGNOSIS — G4733 Obstructive sleep apnea (adult) (pediatric): Secondary | ICD-10-CM | POA: Diagnosis not present

## 2022-04-22 DIAGNOSIS — M542 Cervicalgia: Secondary | ICD-10-CM | POA: Diagnosis not present

## 2022-04-22 DIAGNOSIS — M6281 Muscle weakness (generalized): Secondary | ICD-10-CM | POA: Diagnosis not present

## 2022-04-22 DIAGNOSIS — M2560 Stiffness of unspecified joint, not elsewhere classified: Secondary | ICD-10-CM | POA: Diagnosis not present

## 2022-04-22 DIAGNOSIS — M545 Low back pain, unspecified: Secondary | ICD-10-CM | POA: Diagnosis not present

## 2022-04-29 DIAGNOSIS — M2560 Stiffness of unspecified joint, not elsewhere classified: Secondary | ICD-10-CM | POA: Diagnosis not present

## 2022-04-29 DIAGNOSIS — M542 Cervicalgia: Secondary | ICD-10-CM | POA: Diagnosis not present

## 2022-04-29 DIAGNOSIS — M6281 Muscle weakness (generalized): Secondary | ICD-10-CM | POA: Diagnosis not present

## 2022-04-29 DIAGNOSIS — M545 Low back pain, unspecified: Secondary | ICD-10-CM | POA: Diagnosis not present

## 2022-04-30 DIAGNOSIS — M545 Low back pain, unspecified: Secondary | ICD-10-CM | POA: Diagnosis not present

## 2022-04-30 DIAGNOSIS — M2560 Stiffness of unspecified joint, not elsewhere classified: Secondary | ICD-10-CM | POA: Diagnosis not present

## 2022-04-30 DIAGNOSIS — M542 Cervicalgia: Secondary | ICD-10-CM | POA: Diagnosis not present

## 2022-04-30 DIAGNOSIS — M6281 Muscle weakness (generalized): Secondary | ICD-10-CM | POA: Diagnosis not present

## 2022-05-03 DIAGNOSIS — M545 Low back pain, unspecified: Secondary | ICD-10-CM | POA: Diagnosis not present

## 2022-05-03 DIAGNOSIS — M6281 Muscle weakness (generalized): Secondary | ICD-10-CM | POA: Diagnosis not present

## 2022-05-03 DIAGNOSIS — M542 Cervicalgia: Secondary | ICD-10-CM | POA: Diagnosis not present

## 2022-05-03 DIAGNOSIS — M2560 Stiffness of unspecified joint, not elsewhere classified: Secondary | ICD-10-CM | POA: Diagnosis not present

## 2022-05-05 DIAGNOSIS — M542 Cervicalgia: Secondary | ICD-10-CM | POA: Diagnosis not present

## 2022-05-05 DIAGNOSIS — M6281 Muscle weakness (generalized): Secondary | ICD-10-CM | POA: Diagnosis not present

## 2022-05-05 DIAGNOSIS — M2560 Stiffness of unspecified joint, not elsewhere classified: Secondary | ICD-10-CM | POA: Diagnosis not present

## 2022-05-05 DIAGNOSIS — M545 Low back pain, unspecified: Secondary | ICD-10-CM | POA: Diagnosis not present

## 2022-05-18 DIAGNOSIS — M2560 Stiffness of unspecified joint, not elsewhere classified: Secondary | ICD-10-CM | POA: Diagnosis not present

## 2022-05-18 DIAGNOSIS — M6281 Muscle weakness (generalized): Secondary | ICD-10-CM | POA: Diagnosis not present

## 2022-05-18 DIAGNOSIS — M542 Cervicalgia: Secondary | ICD-10-CM | POA: Diagnosis not present

## 2022-05-18 DIAGNOSIS — M545 Low back pain, unspecified: Secondary | ICD-10-CM | POA: Diagnosis not present

## 2022-05-20 DIAGNOSIS — Z6827 Body mass index (BMI) 27.0-27.9, adult: Secondary | ICD-10-CM | POA: Diagnosis not present

## 2022-05-20 DIAGNOSIS — M5412 Radiculopathy, cervical region: Secondary | ICD-10-CM | POA: Diagnosis not present

## 2022-05-20 DIAGNOSIS — M542 Cervicalgia: Secondary | ICD-10-CM | POA: Diagnosis not present

## 2022-05-20 DIAGNOSIS — M5416 Radiculopathy, lumbar region: Secondary | ICD-10-CM | POA: Diagnosis not present

## 2022-05-21 DIAGNOSIS — M545 Low back pain, unspecified: Secondary | ICD-10-CM | POA: Diagnosis not present

## 2022-05-21 DIAGNOSIS — M6281 Muscle weakness (generalized): Secondary | ICD-10-CM | POA: Diagnosis not present

## 2022-05-21 DIAGNOSIS — G4733 Obstructive sleep apnea (adult) (pediatric): Secondary | ICD-10-CM | POA: Diagnosis not present

## 2022-05-21 DIAGNOSIS — M2560 Stiffness of unspecified joint, not elsewhere classified: Secondary | ICD-10-CM | POA: Diagnosis not present

## 2022-05-21 DIAGNOSIS — M542 Cervicalgia: Secondary | ICD-10-CM | POA: Diagnosis not present

## 2022-05-26 DIAGNOSIS — M79604 Pain in right leg: Secondary | ICD-10-CM | POA: Diagnosis not present

## 2022-05-26 DIAGNOSIS — M5416 Radiculopathy, lumbar region: Secondary | ICD-10-CM | POA: Diagnosis not present

## 2022-05-26 DIAGNOSIS — M545 Low back pain, unspecified: Secondary | ICD-10-CM | POA: Diagnosis not present

## 2022-06-07 DIAGNOSIS — Z03818 Encounter for observation for suspected exposure to other biological agents ruled out: Secondary | ICD-10-CM | POA: Diagnosis not present

## 2022-06-07 DIAGNOSIS — Z20822 Contact with and (suspected) exposure to covid-19: Secondary | ICD-10-CM | POA: Diagnosis not present

## 2022-06-07 DIAGNOSIS — R051 Acute cough: Secondary | ICD-10-CM | POA: Diagnosis not present

## 2022-06-07 DIAGNOSIS — J00 Acute nasopharyngitis [common cold]: Secondary | ICD-10-CM | POA: Diagnosis not present

## 2022-06-07 DIAGNOSIS — I10 Essential (primary) hypertension: Secondary | ICD-10-CM | POA: Diagnosis not present

## 2022-06-11 DIAGNOSIS — J0140 Acute pansinusitis, unspecified: Secondary | ICD-10-CM | POA: Diagnosis not present

## 2022-06-11 DIAGNOSIS — J029 Acute pharyngitis, unspecified: Secondary | ICD-10-CM | POA: Diagnosis not present

## 2022-06-11 DIAGNOSIS — I10 Essential (primary) hypertension: Secondary | ICD-10-CM | POA: Diagnosis not present

## 2022-06-20 DIAGNOSIS — G4733 Obstructive sleep apnea (adult) (pediatric): Secondary | ICD-10-CM | POA: Diagnosis not present

## 2022-07-05 DIAGNOSIS — Z0000 Encounter for general adult medical examination without abnormal findings: Secondary | ICD-10-CM | POA: Diagnosis not present

## 2022-07-05 DIAGNOSIS — E1169 Type 2 diabetes mellitus with other specified complication: Secondary | ICD-10-CM | POA: Diagnosis not present

## 2022-07-05 DIAGNOSIS — K760 Fatty (change of) liver, not elsewhere classified: Secondary | ICD-10-CM | POA: Diagnosis not present

## 2022-07-05 DIAGNOSIS — Z125 Encounter for screening for malignant neoplasm of prostate: Secondary | ICD-10-CM | POA: Diagnosis not present

## 2022-07-05 DIAGNOSIS — E782 Mixed hyperlipidemia: Secondary | ICD-10-CM | POA: Diagnosis not present

## 2022-07-05 DIAGNOSIS — I10 Essential (primary) hypertension: Secondary | ICD-10-CM | POA: Diagnosis not present

## 2022-07-09 DIAGNOSIS — Z0000 Encounter for general adult medical examination without abnormal findings: Secondary | ICD-10-CM | POA: Diagnosis not present

## 2022-07-09 DIAGNOSIS — E781 Pure hyperglyceridemia: Secondary | ICD-10-CM | POA: Diagnosis not present

## 2022-07-09 DIAGNOSIS — K760 Fatty (change of) liver, not elsewhere classified: Secondary | ICD-10-CM | POA: Diagnosis not present

## 2022-07-09 DIAGNOSIS — E1169 Type 2 diabetes mellitus with other specified complication: Secondary | ICD-10-CM | POA: Diagnosis not present

## 2022-07-09 DIAGNOSIS — I10 Essential (primary) hypertension: Secondary | ICD-10-CM | POA: Diagnosis not present

## 2022-07-24 ENCOUNTER — Other Ambulatory Visit: Payer: Self-pay | Admitting: Internal Medicine

## 2022-07-24 DIAGNOSIS — E782 Mixed hyperlipidemia: Secondary | ICD-10-CM

## 2022-09-06 ENCOUNTER — Ambulatory Visit: Payer: BC Managed Care – PPO | Attending: Internal Medicine | Admitting: Internal Medicine

## 2022-09-06 ENCOUNTER — Encounter: Payer: Self-pay | Admitting: Internal Medicine

## 2022-09-06 VITALS — BP 112/64 | HR 76 | Ht 71.0 in | Wt 194.6 lb

## 2022-09-06 DIAGNOSIS — E119 Type 2 diabetes mellitus without complications: Secondary | ICD-10-CM

## 2022-09-06 DIAGNOSIS — E781 Pure hyperglyceridemia: Secondary | ICD-10-CM | POA: Diagnosis not present

## 2022-09-06 DIAGNOSIS — I10 Essential (primary) hypertension: Secondary | ICD-10-CM

## 2022-09-06 NOTE — Progress Notes (Signed)
LIPID CLINIC NOTE  Chief Complaint:  Follow-up dyslipidemia  Primary Care Physician: Sueanne Margarita, MD  Primary Cardiologist:  Pixie Casino, MD  HPI:  Allen Thomas is a 56 y.o. male who is being seen today for the evaluation of dyslipidemia at the request of Turner, Eber Hong, MD. Allen Thomas is a pleasant 56 year old male who was formally followed by [facility] cardiology with a history of dyslipidemia and early onset coronary disease.  He also has type 2 diabetes, mildly elevated liver enzymes, depression/anxiety on Paxil, and obesity which she attributes to the antidepressant medication.  It was previously followed by [employer], Pharm.D. at the [facility] lipid clinic.  In 2012 he underwent heart catheterization by Dr. Doreatha Lew which showed no significant obstructive coronary disease.  Subsequently he was followed by Cecilie Kicks and Dr. Radford Pax and had coronary calcium screening in 2016.  This was scored as 0 with no evidence of coronary disease.  He also has obstructive sleep apnea and has been on CPAP.  He is currently on high intensity rosuvastatin, Lovaza, and fenofibrate.  His most recent lipid profile was on May 28, 2019.  Total cholesterol was 138, HDL 28, LDL 62 and triglycerides 413.  Hemoglobin A1c was 7.4.  TSH was 1.49.  He reports a reasonably healthy diet however during the COVID pandemic says that he is eaten more saturated foods and not been as strict about following carbohydrates.  01/10/2020  Allen Thomas is seen today via telephone visit.  He reports feeling better.  He has had problems with his neck and recently was in the emergency department this past September with chest pain.  Ultimately was thought to be coming from his neck.  He has had surgery.  He says he is getting better.  Unfortunately he has been more sedentary.  He reports being compliant with his medications including the Vascepa on top of his fenofibrate and rosuvastatin.  Labs 2 weeks ago showed total  cholesterol 127, triglycerides 532, HDL 24 and LDL of 30 (NIH calculated)-his direct LDL was 40.  He reports his glycemic control has not been optimal either.  His liver enzymes are relatively normal with mild ALT elevation.  08/11/2020  Allen Thomas seems to be doing well. He denies any pancreatitis. He had recent labs which show an improvement in his triglycerides from 532 down to 364. Total cholesterol is 159 with an LDL 74. He remains compliant with high potency rosuvastatin, Vascepa 2 g twice daily and fenofibrate. He recently had an increase in his A1c over 8.0 and was placed on Actos. This is helped his blood sugars and his triglycerides.  03/01/2022  Allen Thomas is seen today for the first time in the office.  His visits previously been virtual.  He seems to be doing well.  His cholesterol however did go up somewhat over the holidays.  Labs in January showed total cholesterol of 181, triglycerides 562, HDL 34 and hemoglobin A1c was 6.7%.  He reported this may have been dietary over the holidays.  He continues to be compliant with his rosuvastatin 40 mg daily, Vascepa 2 g twice daily and fenofibrate 135 mg daily.  He is also now on 25 mg Jardiance in addition to metformin and Actos.  He reports his activity level was less however is recently picked up and his fasting blood sugars have improved.  09/06/2022  Allen Thomas is seen today in follow-up.  Overall he is doing well.  He had labs this summer  through his PCP.  Total cholesterol 133, HDL 33, triglycerides 296 and a LDL 54.  He remains on rosuvastatin 40 mg daily and Vascepa 2 g twice daily.  This is actually the lowest his triglycerides have been.  His hemoglobin A1c was 6.5% and a lot of this has to do with diet and physical activity in addition to statin medications.  He had a recent repeat sleep study after the weight loss but this shows persistent moderate obstructive sleep apnea and he was advised to continue using his CPAP.  PMHx:  Past  Medical History:  Diagnosis Date   Anxiety    Depression    Diabetes mellitus    Type II   Elevated LFTs    Secondary to fatty liver   History of tobacco abuse    Hypertension    Mixed dyslipidemia    Obesity    Psoriasis    followed by Dermatologist Dr Yetta Barre   Sleep apnea    settings at 11    Tinnitus 2009   followed by ENT Dr Pollyann Kennedy    Past Surgical History:  Procedure Laterality Date   BACK SURGERY     L5-S1 back surgery    CARDIAC CATHETERIZATION  2004   normal coronary arteries   KNEE ARTHROSCOPY  07/27/2012   Procedure: ARTHROSCOPY KNEE;  Surgeon: Kathryne Hitch, MD;  Location: [facility];  Service: Orthopedics;  Laterality: Left;  Left knee arthroscopy with debridement and medial menisectomy    FAMHx:  Family History  Problem Relation Age of Onset   CAD Mother    Stroke Brother     SOCHx:   reports that he quit smoking about 18 years ago. His smoking use included cigarettes. He has quit using smokeless tobacco. He reports current alcohol use. He reports that he does not use drugs.  ALLERGIES:  No Known Allergies  ROS: Pertinent items noted in HPI and remainder of comprehensive ROS otherwise negative.  HOME MEDS: Current Outpatient Medications on File Prior to Visit  Medication Sig Dispense Refill   betamethasone dipropionate (DIPROLENE) 0.05 % ointment Apply 1 application topically daily.     Choline Fenofibrate (FENOFIBRIC ACID) 135 MG CPDR TAKE 1 CAPSULE (135 MG TOTAL) BY MOUTH EVERY EVENING. 90 capsule 3   Coal Tar Extract 1.25 % GEL Apply topically as needed. For psoriasis on elbows     cyclobenzaprine (FLEXERIL) 5 MG tablet Take 1 tablet by mouth as needed.     hydrocortisone 2.5 % cream Apply topically 2 (two) times daily.     JARDIANCE 25 MG TABS tablet Take 25 mg by mouth daily.     lisinopril (ZESTRIL) 20 MG tablet Take 20 mg by mouth daily.     LORazepam (ATIVAN) 1 MG tablet Take 0.25 mg by mouth 3 (three) times daily.   0   meloxicam  (MOBIC) 7.5 MG tablet Take 7.5 mg by mouth as needed.     metaxalone (SKELAXIN) 800 MG tablet Take 800 mg by mouth 3 (three) times daily as needed for muscle spasms.     metFORMIN (GLUCOPHAGE) 500 MG tablet Take 1,000 mg by mouth 2 (two) times daily with a meal.      Multiple Vitamin (MULTIVITAMIN) tablet Take 1 tablet by mouth daily.     PARoxetine (PAXIL) 40 MG tablet Take 40 mg by mouth every evening.      pioglitazone (ACTOS) 15 MG tablet TAKE 1 TABLET BY MOUTH EVERY DAY 90 tablet 3   rosuvastatin (CRESTOR) 40 MG  tablet TAKE 1 TABLET BY MOUTH EVERY DAY 90 tablet 1   VASCEPA 1 g capsule TAKE 2 CAPSULES BY MOUTH TWICE A DAY 120 capsule 3   No current facility-administered medications on file prior to visit.    LABS/IMAGING: No results found for this or any previous visit (from the past 48 hour(s)). No results found.  LIPID PANEL:    Component Value Date/Time   CHOL 159 08/04/2020 0910   TRIG 364 (H) 08/04/2020 0910   HDL 27 (L) 08/04/2020 0910   CHOLHDL 5.9 (H) 08/04/2020 0910   LDLCALC 74 08/04/2020 0910   LDLDIRECT 67 08/04/2020 0910    WEIGHTS: Wt Readings from Last 3 Encounters:  09/06/22 194 lb 9.6 oz (88.3 kg)  03/01/22 196 lb 9.6 oz (89.2 kg)  02/13/21 210 lb (95.3 kg)    VITALS: BP 112/64   Pulse 76   Ht 5\' 11"  (1.803 m)   Wt 194 lb 9.6 oz (88.3 kg)   SpO2 98%   BMI 27.14 kg/m   EXAM: deferred  EKG: deferred  ASSESSMENT: Mixed dyslipidemia with high triglycerides No evidence of coronary disease by cardiac cath (2012) CAC score of 0 (2016) Type 2 diabetes-A1c 6.5% OSA on CPAP Essential hypertension  PLAN: 1.   Allen Thomas continues to do well with further improvement in his lipids.  LDL is now below 55 with triglycerides in the 200s.  He had no coronary calcium in 2016 and no obstructive disease by cath in 2012.  His A1c is improved down to 6.5.  He is compliant with CPAP and blood pressure is well controlled.  Overall he is doing well.  Follow-up  with me annually or sooner as necessary.  2013, MD, [facility], FACP  [place]  [facility]  Medical Director of the Advanced Lipid Disorders &  Cardiovascular Risk Reduction Clinic Diplomate of the American Board of Clinical Lipidology Attending Cardiologist  Direct Dial: [phone]  Fax: [phone]  Website:  [email or web address] Rihan Schueler 09/06/2022, 8:24 AM

## 2022-09-06 NOTE — Patient Instructions (Signed)
Medication Instructions:  Your physician recommends that you continue on your current medications as directed. Please refer to the Current Medication list given to you today.  *If you need a refill on your cardiac medications before your next appointment, please call your pharmacy*   Follow-Up: At [facility], you and your health needs are our priority.  As part of our continuing mission to provide you with exceptional heart care, we have created designated Provider Care Teams.  These Care Teams include your primary Cardiologist (physician) and Advanced Practice Providers (APPs -  Physician Assistants and Nurse Practitioners) who all work together to provide you with the care you need, when you need it.  We recommend signing up for the patient portal called "MyChart".  Sign up information is provided on this After Visit Summary.  MyChart is used to connect with patients for Virtual Visits (Telemedicine).  Patients are able to view lab/test results, encounter notes, upcoming appointments, etc.  Non-urgent messages can be sent to your provider as well.   To learn more about what you can do with MyChart, go to [email or web address].    Your next appointment:   12 month(s)  The format for your next appointment:   In Person  Provider:   Kenneth C Hilty, MD    

## 2022-11-19 ENCOUNTER — Other Ambulatory Visit: Payer: Self-pay | Admitting: Internal Medicine

## 2022-11-19 DIAGNOSIS — E782 Mixed hyperlipidemia: Secondary | ICD-10-CM

## 2022-11-22 DIAGNOSIS — J09X2 Influenza due to identified novel influenza A virus with other respiratory manifestations: Secondary | ICD-10-CM | POA: Diagnosis not present

## 2022-11-22 DIAGNOSIS — J029 Acute pharyngitis, unspecified: Secondary | ICD-10-CM | POA: Diagnosis not present

## 2022-11-22 DIAGNOSIS — Z6827 Body mass index (BMI) 27.0-27.9, adult: Secondary | ICD-10-CM | POA: Diagnosis not present

## 2023-02-21 ENCOUNTER — Other Ambulatory Visit: Payer: Self-pay | Admitting: Internal Medicine

## 2023-03-16 ENCOUNTER — Other Ambulatory Visit: Payer: Self-pay | Admitting: Internal Medicine

## 2023-03-16 DIAGNOSIS — E782 Mixed hyperlipidemia: Secondary | ICD-10-CM

## 2023-06-16 ENCOUNTER — Other Ambulatory Visit: Payer: Self-pay | Admitting: Internal Medicine

## 2023-06-16 DIAGNOSIS — E782 Mixed hyperlipidemia: Secondary | ICD-10-CM

## 2023-07-30 ENCOUNTER — Other Ambulatory Visit: Payer: Self-pay | Admitting: Internal Medicine

## 2023-08-02 ENCOUNTER — Telehealth: Payer: Self-pay

## 2023-08-02 DIAGNOSIS — Z79899 Other long term (current) drug therapy: Secondary | ICD-10-CM

## 2023-08-02 DIAGNOSIS — E7800 Pure hypercholesterolemia, unspecified: Secondary | ICD-10-CM

## 2023-08-02 NOTE — Telephone Encounter (Signed)
Orders placed for Flp in 6 weeks.

## 2023-08-02 NOTE — Telephone Encounter (Signed)
Called patient to review lipids still not at goal. Patient states he was fasting for these labs, but that he also starting drinking a shake everyday that apparently did not help his nutrition or labs. He states he has stopped drinking this shake and is willing to retest as needed. Lipid clinic referral placed.

## 2023-08-02 NOTE — Addendum Note (Signed)
Addended by: Luellen Pucker on: 08/02/2023 03:04 PM   Modules accepted: Orders

## 2023-08-02 NOTE — Telephone Encounter (Signed)
-----   Message from Armanda Magic sent at 07/25/2023  3:53 PM EDT ----- Lipids still not at goal.  Please forward to lipid clinic for further recommendations.  TAGS still very elevated please find out if he was fasting for these labs

## 2023-09-09 ENCOUNTER — Other Ambulatory Visit: Payer: Self-pay | Admitting: Internal Medicine

## 2023-09-09 DIAGNOSIS — E782 Mixed hyperlipidemia: Secondary | ICD-10-CM

## 2023-09-09 MED ORDER — ICOSAPENT ETHYL 1 G PO CAPS: 2.0000 g | ORAL_CAPSULE | Freq: Two times a day (BID) | ORAL | 0 refills | Status: DC

## 2023-09-19 ENCOUNTER — Ambulatory Visit: Payer: No Typology Code available for payment source | Attending: Cardiology

## 2023-09-19 DIAGNOSIS — Z79899 Other long term (current) drug therapy: Secondary | ICD-10-CM

## 2023-09-19 DIAGNOSIS — E7800 Pure hypercholesterolemia, unspecified: Secondary | ICD-10-CM

## 2023-09-20 LAB — LIPID PANEL
Chol/HDL Ratio: 5.3 {ratio} — ABNORMAL HIGH (ref 0.0–5.0)
Cholesterol, Total: 163 mg/dL (ref 100–199)
HDL: 31 mg/dL — ABNORMAL LOW (ref >39)
LDL Chol Calc (NIH): 69 mg/dL (ref 0–99)
Triglycerides: 400 mg/dL — ABNORMAL HIGH (ref 0–149)
VLDL Cholesterol Cal: 63 mg/dL — ABNORMAL HIGH (ref 5–40)

## 2023-09-20 LAB — ALT: ALT: 43 [IU]/L (ref 0–44)

## 2023-09-27 NOTE — Progress Notes (Unsigned)
Cardiology Office Note:  .   Date:  09/28/2023  ID:  Allen Thomas, DOB 1966/09/27, MRN [id number] PCP: Quintella Reichert, MD  [facility] Providers Cardiologist:  Chrystie Nose, MD    Patient Profile: .      PMH Dyslipidemia Type 2 DM Mildly elevated liver enzymes Obesity Depression Anxiety OSA on CPAP  Referred to advanced lipid clinic and seen by Dr. Rennis Golden 08/16/2019.  He underwent cardiac catheterization in 2012 which showed no significant obstructive coronary disease.  He has subsequently been followed by Dr. Mayford Knife and had coronary calcium screening 2016 with a score of 0.  He had been managed on rosuvastatin, Lovaza, and fenofibrate with lipid panel at the time that showed total cholesterol 138, HDL 28, LDL 62 and triglycerides 413.  Hemoglobin A1c was 7.4.  He was advised to switch from Lovaza to [facility] for additional cardiovascular benefit.  At follow-up, he had had recent neck surgery and admitted to being more sedentary.  Total cholesterol was 127, triglycerides 532, HDL 24, and LDL 30 (NIH calculated), direct LDL was 40.  He has continued to have fluctuations and lipids and A1c.  Last clinic visit was 09/06/2022 with total cholesterol 133, HDL 33, triglycerides 296, and LDL 54.  He was compliant on rosuvastatin 40 mg daily and Vascepa 2 g twice daily.  Hemoglobin A1c was 6.5% with weight loss, diet, and physical activity.        History of Present Illness: .   Allen Thomas is a very pleasant 57 y.o. male who is here today for management of dyslipidemia. He has previously followed with general cardiology but recently has only been seen by Dr. Rennis Golden for management of dyslipidemia. He reports he is overall feeling well. Admits he has been less active over the past 4 years due to 2 neck surgeries. He generally walk on the weekends and does yard work.  Gained a significant amount of weight on Paxil when he was in his 30s.  He has continually lost weight over the past several  years and noted an improvement particularly when he started [place]. He denies chest pain, shortness of breath, or other cardiac symptoms. He has a history of high triglycerides, remaining elevated even when he is on medication. "Lowest they have been is in the 200s." He expresses concern about the potential impact of his medications on his liver and kidneys. Significant family history of heart disease which concerns him because his dad was asymptomatic.  He has occasional discomfort in his upper body and abdomen but no discomfort associated with exertion. Typically follows a healthy diet through the week but eats more french fries, biscuits and tater tots on the weekends.   ROS: See HPI       Studies Reviewed: Marland Kitchen   EKG Interpretation Date/Time:  Wednesday September 28 2023 15:26:13 EDT Ventricular Rate:  97 PR Interval:  170 QRS Duration:  118 QT Interval:  382 QTC Calculation: 485 R Axis:   -46  Text Interpretation: Sinus rhythm with Fusion complexes Right bundle branch block Left anterior fascicular block Bifascicular block Possible Inferior infarct , age undetermined When compared with ECG of 07-Sep-2019 11:37, PREVIOUS ECG IS PRESENT Confirmed by Eligha Bridegroom [phone]) on 09/28/2023 3:41:21 PM     Risk Assessment/Calculations:         STOP-Bang Score:         Physical Exam:   VS:  BP 120/78 (BP Location: Left Arm, Patient Position: Sitting, Cuff Size: Normal)  Pulse 97   Ht 5\' 11"  (1.803 m)   Wt 197 lb 6.4 oz (89.5 kg)   SpO2 95%   BMI 27.53 kg/m    Wt Readings from Last 3 Encounters:  09/28/23 197 lb 6.4 oz (89.5 kg)  09/06/22 194 lb 9.6 oz (88.3 kg)  03/01/22 196 lb 9.6 oz (89.2 kg)    GEN: Well nourished, well developed in no acute distress NECK: No JVD; No carotid bruits CARDIAC: RRR, no murmurs, rubs, gallops RESPIRATORY:  Clear to auscultation without rales, wheezing or rhonchi  ABDOMEN: Soft, non-tender, non-distended EXTREMITIES:  No edema; No deformity      ASSESSMENT AND PLAN: .    New right bundle branch block/LAFB: We discussed new finding on EKG.  He is not having any symptoms of chest pain or shortness of breath. Only mild edema at the end of the day.  No orthopnea or PND. We will get echocardiogram to assess heart function and evaluate for structural heart disease.  Given risk factors of family history of early CAD, history of hypertension, diabetes, and dyslipidemia we will get exercise Myoview for evaluation of ischemia. I have encouraged him to gradually increase exercise and notify us if symptoms worsen.   Dyslipidemia: Lipid panel 09/19/2023 with total cholesterol 163, triglycerides 400, HDL 31, LDL 69.  He admits he could improve his diet, particularly on the weekends.  Encouraged dietary changes to limit saturated fat, sugar, processed foods, and simple carbohydrates.  Encouraged increased physical activity to achieve 150 minutes of moderate intensity exercise each week, particularly walking after meals for elevated triglycerides.  He is tolerating medications without any concerning side effects.  We will continue fenofibrate, Vascepa, and Crestor. Consider repeat lipid testing in 2-3 months pending lifestyle improvements.   Diabetes: A1c improved to 6.5 on Jardiance.  He is very pleased with the improvement. Consider additional dietary improvements.   Hypertension: BP is well-controlled. Continue lisinopril.      Informed Consent   Shared Decision Making/Informed Consent The risks [chest pain, shortness of breath, cardiac arrhythmias, dizziness, blood pressure fluctuations, myocardial infarction, stroke/transient ischemic attack, nausea, vomiting, allergic reaction, radiation exposure, metallic taste sensation and life-threatening complications (estimated to be 1 in 10,000)], benefits (risk stratification, diagnosing coronary artery disease, treatment guidance) and alternatives of a nuclear stress test were discussed in detail with Mr.  Kingry and he agrees to proceed.     Dispo: 2-3 months with me  Signed, Eligha Bridegroom, NP-C

## 2023-09-28 ENCOUNTER — Encounter (HOSPITAL_BASED_OUTPATIENT_CLINIC_OR_DEPARTMENT_OTHER): Payer: Self-pay

## 2023-09-28 ENCOUNTER — Ambulatory Visit (HOSPITAL_BASED_OUTPATIENT_CLINIC_OR_DEPARTMENT_OTHER): Payer: No Typology Code available for payment source | Admitting: Nurse Practitioner

## 2023-09-28 ENCOUNTER — Encounter (HOSPITAL_BASED_OUTPATIENT_CLINIC_OR_DEPARTMENT_OTHER): Payer: Self-pay | Admitting: Nurse Practitioner

## 2023-09-28 VITALS — BP 120/78 | HR 97 | Ht 71.0 in | Wt 197.4 lb

## 2023-09-28 DIAGNOSIS — I10 Essential (primary) hypertension: Secondary | ICD-10-CM

## 2023-09-28 DIAGNOSIS — E7800 Pure hypercholesterolemia, unspecified: Secondary | ICD-10-CM

## 2023-09-28 DIAGNOSIS — I4510 Unspecified right bundle-branch block: Secondary | ICD-10-CM

## 2023-09-28 DIAGNOSIS — I447 Left bundle-branch block, unspecified: Secondary | ICD-10-CM

## 2023-09-28 DIAGNOSIS — Z8249 Family history of ischemic heart disease and other diseases of the circulatory system: Secondary | ICD-10-CM

## 2023-09-28 DIAGNOSIS — E119 Type 2 diabetes mellitus without complications: Secondary | ICD-10-CM

## 2023-09-28 DIAGNOSIS — E781 Pure hyperglyceridemia: Secondary | ICD-10-CM

## 2023-09-28 NOTE — Patient Instructions (Addendum)
Medication Instructions:   Your physician recommends that you continue on your current medications as directed. Please refer to the Current Medication list given to you today.   *If you need a refill on your cardiac medications before your next appointment, please call your pharmacy*   Lab Work:  None ordered.  If you have labs (blood work) drawn today and your tests are completely normal, you will receive your results only by: MyChart Message (if you have MyChart) OR A paper copy in the mail If you have any lab test that is abnormal or we need to change your treatment, we will call you to review the results.   Testing/Procedures:  Your physician has requested that you have an echocardiogram. Echocardiography is a painless test that uses sound waves to create images of your heart. It provides your doctor with information about the size and shape of your heart and how well your heart's chambers and valves are working. This procedure takes approximately one hour. There are no restrictions for this procedure. Please do NOT wear cologne, aftershave, or lotions (deodorant is allowed). Please arrive 15 minutes prior to your appointment time.  You are scheduled for a Myocardial Perfusion Imaging Study on  Tuesday, Nov 26 at  7:15 am.                     .   Please arrive 15 minutes prior to your appointment time for registration and insurance purposes.   The test will take approximately 3 to 4 hours to complete; you may bring reading material. If someone comes with you to your appointment, they will need to remain in the main lobby due to limited space in the testing area.   How to prepare for your Myocardial Perfusion test:   Do not eat or drink 3 hours prior to your test, except you may have water.    Do not consume products containing caffeine (regular or decaffeinated) 12 hours prior to your test (ex: coffee, chocolate, soda, tea)   Do bring a list of your current medications with  you. If not listed below, you may take your medications as normal.    Do not take jardiance, actos and metformin prior to the test.   Bring any held medication to your appointment, as you may be required to take it once the test is complete.   Do wear comfortable clothes (no dresses or overalls) and walking shoes. Tennis shoes are preferred. No heels or open toed shoes.  Do not wear cologne, perfume, aftershave or lotions (deodorant is allowed).   If these instructions are not followed, you test will have to be rescheduled.   Please report to [address] for your test. If you have questions or concerns about your appointment, please call the Nuclear Lab at #[phone].  If you cannot keep your appointment, please provide 24 hour notification to the Nuclear lab to avoid a possible $50 charge to your account.      Follow-Up: At [facility], you and your health needs are our priority.  As part of our continuing mission to provide you with exceptional heart care, we have created designated Provider Care Teams.  These Care Teams include your primary Cardiologist (physician) and Advanced Practice Providers (APPs -  Physician Assistants and Nurse Practitioners) who all work together to provide you with the care you need, when you need it.  We recommend signing up for the patient portal called "MyChart".  Sign up information is provided on this After Visit Summary.  MyChart is used to connect with patients for Virtual Visits (Telemedicine).  Patients are able to view lab/test results, encounter notes, upcoming appointments, etc.  Non-urgent messages can be sent to your provider as well.   To learn more about what you can do with MyChart, go to [email or web address].    Your next appointment:   3 month(s)  Provider:   Eligha Bridegroom, NP    Other Instructions  Tackling Obesity with Lifestyle Changes  Obesity- What is it? And What can we do about  it?  Obesity is a chronic complex disease defined as excessive fat deposits that can have a negative effect on our health. It can lead to many other diseases including type 2 diabetes.  Weight gain occurs when the amount of energy (calories) we consume is greater than the amount we use.  When our energy output is greater than our energy input we lose weight. The basic concept is simple, but in reality, it's much more complicated.  Unfortunately, in some people, our bodies have many ways it can compensate when we try to eat less and move more which can prevent [place] from changing our weight. This can lead to some people having a much more difficult time losing weight even when they put healthy habits into practice. This can be frustrating. We want to focus on healthy habits, physical activity and how we feel, and less the number on the scale.  Food As Energy  Calories  Calories is just a unit of measurement for energy.  Counting calories is not required to lose weight but counting for a short period of time can:   help you learn good portion sizes   Learn what your true energy needs are.   Help you be more aware of your snacking or grazing habits  To help calculate how many calories you should be eating, the NIH has a great body weight planner calculator at BeverageBuggy.si  Types of Energy Expenditure  Basal Metabolic Rate (BMR) Energy that our bodies use to preform everyday tasks. More muscle mass through resistance training can increase this a small amount  Thermic Effect of Food The amount of energy that it takes to breakdown the food we eat. This will be highest when we eat protein and fiber rich foods  Exercise Energy Expenditure The amount of energy used during formal exercise (walking, biking, weightlifting)  Non-exercise activity thermogenesis (NEAT) The amount of energy spent on activities that are not formal exercise (standing, fidgeting). Therefore, it is not only  important to do formal exercise but also move around throughout the day.  Managing The Meal  Macro nutrients (carbohydrates, fats and protein, fiber, water)  Micronutrients (vitamins, minerals)  Dietary Fiber  Benefits Examples Cautions  Soluble fiber  Decreases cholesterol  improve blood sugar control,  Feeds our gut bacteria  Allows [place] to feel fuller for longer so we eat less  fruits  oats  barley  legumes  peas  Beans  vegetables (broccoli) and root vegetables (carrots) Add fiber into your diet slowly and be sure to drink at least 8 cups of water a day. This will help limit gas, bloating, diarrhea, or constipation.  Insoluble Fiber  Improves digestive health by making stool easier to pass  Allows [place] to feel fuller for longer so we eat less  whole grains  nuts  seeds  skin of fruit  vegetables (green beans, zucchini, cauliflower)  Tricks to add more  fiber to your diet   Add beans (pinto, kidney, lima, navy and garbanzo) to salads, ground meat or brown rice   Add nuts or seeds and or fresh/frozen fruit to yogurt, cottage cheese, salads or steel cut oats   Cut up vegetables and eat with hummus   Look for unsweetened whole grain cereals with at least 5g of fiber per serving   Switch to whole grain bread. Look for bread that has whole grain flour as the first ingredient and has more fiber than carbs if you were to multiple the fiber x 10.   Try bulgar, barely, quinoa, buckwheat, brown rice wild rice instead of white rice   Keep frozen vegetables on hand to add to dishes or soups  Meal Planning:  Meal planning is the key to setting you up for success. Here are some examples of healthy meal options.  Breakfast  Option 1: Omelette with vegetables (1 egg, spinach, mushrooms, or other vegetable of your choice), 2 slices whole-grain toast, tip of thumb size butter or soft margarine,  cup low-fat milk or yogurt  Option 2: steel-cut rolled oats (? cup dry), 1  tbsp peanut butter added to cooked oats,  cup low-fat milk.  Option 3: 2 slices whole-grain or rye toast with avocado spread ( small avocado mased with herbs and pepper to taste), 1 poached egg or [place] up (cooked to your liking)  Option 4:  cup plain 0% [place] yogurt topped with  cup berries and  cup walnuts or almonds, 2 slices whole-grain or rye toast, tip of thumb size soft margarine/butter  Lunch:  Option 1: 2 cups red lentil soup, green salad with 1 tbsp homemade vinaigrette (extra virgin olive oil and vinegar of choice plus spices)  Option 2: 3 oz. roasted chicken, 2 slices whole-grain bread, 2 tsp mayonnaise, mustard, lettuce, tomato if desired, 1 fruit (example: medium-sized apple or small pear)  Option 3: 3 oz. tuna packed in water, 1 whole-wheat pita (6 inch), 2 tsp mayonnaise, lettuce, tomato, or other non-starchy vegetable of your choice, 1 fruit (example: medium-sized apple or small pear)  Option 4: 1 serving of garden veggie buddha bowl with lentils and tahini sauce and 1 cup berries topped with  cup plain 0% Greek yogurt  Dinner:  Option 1: 1 serving roasted cauliflower salad, 3-4 oz. grilled or baked pork loin chop, 1/2 cup mashed potato, or brown rice or quinoa  Option 2: 1 serving fish (baked, grilled or air fried), green salad, 1 tbsp homemade vinaigrette,  cup cooked couscous  Option 3: 1 cup cooked whole grained pasta (example: spaghetti, spirals, macaroni),  cup favorite pasta sauce (preferably homemade), 3-4 oz. grilled or baked chicken, green salad, 1 tbsp homemade vinaigrette  Option 4: 1 serving oven roasted salmon,  cup mashed sweet potato or couscous or brown rice or quinoa, broccoli (steamed or roasted)  Healthy snacks:   Carrots or celery with 1 tbsp of hummus   1 medium-sized fruit (apple or orange)   1 cup plain 0% [place] yogurt with  cup berries   Half apple, sliced, with 1 tbsp (15 mL) peanut or almond butter  Dining  out:  Eating away from home has become a part of many people's lifestyle. Making healthy choices when you are eating out is important too. Portion size is an important part of healthy choices. Most branded fast-food places provide calories, sodium, and fat content for their menu items. www.calorieking.com would be great resource to find nutrition facts for your  favorite brands and fast-food restaurants. Company specific website can be Chief Technology Officer for nutrition information for their items. (e.g. www.mcdonalds.com or www.nutritionix.com/[place]/menu/premium)  Here are some tips to help you make wise food choices when you are dining out.  Chose more often Avoid  Beverages  Water, low fat milk  Sugar-free/diet drinks  Unsweet tea or coffee  Milkshakes, fruit drinks, regular pop  Alcohol, specialty drinks (e.g. iced cappuccino)  Fast food  Garden salad  Mini subs, pita sandwiches ect with extra vegetables  plain burgers, grilled chicken  Vegetarian or cheese pizza with whole-grain crust  Burgers/sandwiches with bacon, cheese, and high-fat sauces  [place] fries, fried chicken, fried fish, poutine, hash browns  Pizza with processed meats  Starters  Raw vegetables, salads (garden, spinach, fruit)  clear or vegetable soups  Seafood cocktail  Whole-grain breads and rolls  Salads with high-fat dressings or toppings  Creamy soups  Wings, egg rolls  onion rings, nachos  White or garlic bread  Main courses Grains & Starches (amount equal to  of your plate)  Oatmeal, high-fiber/lower-sugar cereals  Whole-grain breads, rice, pasta, barley, couscous  Sweet potatoes  Sugary, low-fiber cereals  Large bagels, muffins, croissants, white bread  [place] fries, hash browns, fried rice Meat and alternative (amount equal to  of your plate)  Lean meats, poultry, fish, eggs, low-fat cheese  Tofu, vegetable protein Legumes (e.g. lentils, chickpeas, beans)  High-salt and/or high-fat meats (e.g.  ribs, wings, sausages, wieners, processed lunch meats, imposter meats) Vegetables (amount equal to  of your plate)  Salads ([place], garden, spinach), plain vegetables  Salads with creamy, high-fat dressings and   Vegetables on sandwiches ect toppings like bacon bits, croutons, cheese  Desserts  Fresh fruit, frozen yogourt, skim milk latte  Cakes, pies, pastries, ice cream, cheesecake   Adopting a Healthy Lifestyle.   Weight: Know what a healthy weight is for you (roughly BMI <25) and aim to maintain this. You can calculate your body mass index on your smart phone. Unfortunately, this is not the most accurate measure of healthy weight, but it is the simplest measurement to use. A more accurate measurement involves body scanning which measures lean muscle, fat tissue and bony density. We do not have this equipment at [facility].    Diet: Aim for 7+ servings of fruits and vegetables daily Limit animal fats in diet for cholesterol and heart health - choose grass fed whenever available Avoid highly processed foods (fast food burgers, tacos, fried chicken, pizza, hot dogs, french fries)  Saturated fat comes in the form of butter, lard, coconut oil, margarine, partially hydrogenated oils, and fat in meat. These increase your risk of cardiovascular disease.  Use healthy plant oils, such as olive, canola, soy, corn, sunflower and peanut.  Whole foods such as fruits, vegetables and whole grains have fiber  Men need > 38 grams of fiber per day Women need > 25 grams of fiber per day  Load up on vegetables and fruits - one-half of your plate: Aim for color and variety, and remember that potatoes dont count. Go for whole grains - one-quarter of your plate: Whole wheat, barley, wheat berries, quinoa, oats, brown rice, and foods made with them. If you want pasta, go with whole wheat pasta. Protein power - one-quarter of your plate: Fish, chicken, beans, and nuts are all healthy, versatile protein sources.  Limit red meat. You need carbohydrates for energy! The type of carbohydrate is more important than the amount. Choose carbohydrates such as vegetables, fruits,  whole grains, beans, and nuts in the place of white rice, white pasta, potatoes (baked or fried), macaroni and cheese, cakes, cookies, and donuts.  If youre thirsty, drink water. Coffee and tea are good in moderation, but skip sugary drinks and limit milk and dairy products to one or two daily servings. Keep sugar intake at 6 teaspoons or 24 grams or LESS       Exercise: Aim for 150 min of moderate intensity exercise weekly for heart health, and weights twice weekly for bone health Stay active - any steps are better than no steps! Aim for 7-9 hours of sleep daily

## 2023-09-29 NOTE — Telephone Encounter (Signed)
Please review and advise.

## 2023-10-12 ENCOUNTER — Other Ambulatory Visit: Payer: Self-pay | Admitting: Internal Medicine

## 2023-10-12 DIAGNOSIS — E782 Mixed hyperlipidemia: Secondary | ICD-10-CM

## 2023-10-20 ENCOUNTER — Ambulatory Visit (HOSPITAL_COMMUNITY): Payer: No Typology Code available for payment source | Attending: Internal Medicine

## 2023-10-20 DIAGNOSIS — E7800 Pure hypercholesterolemia, unspecified: Secondary | ICD-10-CM | POA: Insufficient documentation

## 2023-10-20 DIAGNOSIS — I4510 Unspecified right bundle-branch block: Secondary | ICD-10-CM | POA: Insufficient documentation

## 2023-10-20 DIAGNOSIS — I10 Essential (primary) hypertension: Secondary | ICD-10-CM | POA: Insufficient documentation

## 2023-10-20 LAB — ECHOCARDIOGRAM COMPLETE
Area-P 1/2: 3.6 cm2
S' Lateral: 3.1 cm

## 2023-11-01 ENCOUNTER — Encounter (HOSPITAL_COMMUNITY): Payer: Self-pay

## 2023-11-08 ENCOUNTER — Ambulatory Visit (HOSPITAL_COMMUNITY): Payer: No Typology Code available for payment source | Attending: Internal Medicine

## 2023-11-08 DIAGNOSIS — E7800 Pure hypercholesterolemia, unspecified: Secondary | ICD-10-CM | POA: Insufficient documentation

## 2023-11-08 DIAGNOSIS — I10 Essential (primary) hypertension: Secondary | ICD-10-CM | POA: Diagnosis present

## 2023-11-08 DIAGNOSIS — I4510 Unspecified right bundle-branch block: Secondary | ICD-10-CM | POA: Diagnosis not present

## 2023-11-08 LAB — MYOCARDIAL PERFUSION IMAGING
Angina Index: 0
Duke Treadmill Score: 8
Estimated workload: 10.1
Exercise duration (min): 8 min
Exercise duration (sec): 1 s
LV dias vol: 86 mL (ref 62–150)
LV sys vol: 37 mL
MPHR: 165 {beats}/min
Nuc Stress EF: 58 %
Peak HR: 162 {beats}/min
Percent HR: 98 %
Rest HR: 73 {beats}/min
Rest Nuclear Isotope Dose: 9.7 mCi
SDS: 1
SRS: 0
SSS: 1
ST Depression (mm): 0 mm
Stress Nuclear Isotope Dose: 32.7 mCi
TID: 1.01

## 2023-11-08 MED ORDER — TECHNETIUM TC 99M TETROFOSMIN IV KIT
32.7000 | PACK | Freq: Once | INTRAVENOUS | Status: AC | PRN
  Administered 2023-11-08: 32.7 via INTRAVENOUS

## 2023-11-08 MED ORDER — TECHNETIUM TC 99M TETROFOSMIN IV KIT
9.7000 | PACK | Freq: Once | INTRAVENOUS | Status: AC | PRN
  Administered 2023-11-08: 9.7 via INTRAVENOUS

## 2023-11-09 ENCOUNTER — Encounter (HOSPITAL_BASED_OUTPATIENT_CLINIC_OR_DEPARTMENT_OTHER): Payer: Self-pay

## 2023-12-24 NOTE — Progress Notes (Signed)
 Cardiology Office Note:  .   Date:  12/28/2023  ID:  [name], DOB 05/16/1966, MRN [id number] PCP: Alejandro Hurt, FNP  [facility] Providers Cardiologist:  Hazle Lites, MD    Patient Profile: .      PMH Dyslipidemia Type 2 DM Mildly elevated liver enzymes Obesity Depression Anxiety OSA on CPAP   Referred to advanced lipid clinic and seen by Dr. Maximo Spar 08/16/2019.  He underwent cardiac catheterization in 2012 which showed no significant obstructive coronary disease.  He has subsequently been followed by Dr. Micael Adas and had coronary calcium  screening 2016 with a score of 0.  He had been managed on rosuvastatin , Lovaza, and fenofibrate  with lipid panel at the time that showed total cholesterol 138, HDL 28, LDL 62 and triglycerides 413.  Hemoglobin A1c was 7.4.  He was advised to switch from Lovaza to Vascepa  for additional cardiovascular benefit.  At follow-up, he had had recent neck surgery and admitted to being more sedentary.  Total cholesterol was 127, triglycerides 532, HDL 24, and LDL 30 (NIH calculated), direct LDL was 40.  He has continued to have fluctuations and lipids and A1c.  Last clinic visit was 09/06/2022 with total cholesterol 133, HDL 33, triglycerides 296, and LDL 54.  He was compliant on rosuvastatin  40 mg daily and Vascepa  2 g twice daily.  Hemoglobin A1c was 6.5% with weight loss, diet, and physical activity.  Seen in clinic by me on 09/28/2023. He has previously followed with general cardiology but recently has only been seen by Dr. Maximo Spar for management of dyslipidemia. Reported overall feeling well. Has been less active over the past 4 years due to 2 neck surgeries. Generally walks on the weekends and does yard work. Gained a significant amount of weight on Paxil when he was in his 30s.  Has continually lost weight over the past several years and noted an improvement particularly when he started Jardiance. He denied chest pain, shortness of breath, or other  cardiac symptoms. He has a history of high triglycerides, remaining elevated even when he is on medication. "Lowest they have been is in the 200s." Expressed concern about the potential impact of his medications on his liver and kidneys. Significant family history of heart disease which concerns him because his dad was asymptomatic.  He has occasional discomfort in his upper body and abdomen but no discomfort associated with exertion. Typically follows a healthy diet through the week but eats more french fries, biscuits and tater tots on the weekends. 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 Advised to have echo which revealed normal LVEF 60-65%, G1DD, normal RV, no significant valve disease. Exercise myoview  performed 11/08/23 revealed no evidence of ischemia or infarction, low risk study. Lipid panel 09/19/2023 with total cholesterol 163, triglycerides 400, HDL 31, LDL 69.       History of Present Illness: .   Allen Thomas is a very pleasant 58 y.o. male who is here today for management of dyslipidemia, and is accompanied by his wife. He reports he is feeling well and has been trying to incorporate more exercise and better dietary habits. Is happy that he did not gain weight over the holiday season. Admits he does not sleep well due to being woken up by CPAP machine.  His wife reports he falls asleep easily during the day.  He has a strong family history of CAD with his father having CABG x 3 at age 59. He did  live to age 50. Patient has been on statin since age 68.  We reviewed echocardiogram and Myoview  results in detail and questions were answered to his satisfaction.  Lengthy discussion about healthy diet, weight loss, and exercise goals for heart health, management of diabetes, and hypertension.  He denies chest pain, shortness of breath, orthopnea, PND, edema, presyncope, syncope.  ROS: See HPI       Studies Reviewed: .         Risk Assessment/Calculations:           Physical Exam:   VS:  BP 118/72 (BP Location: Left Arm, Patient Position: Sitting, Cuff Size: Normal)   Pulse 93   Ht 5\' 11"  (1.803 m)   Wt 196 lb (88.9 kg)   SpO2 98%   BMI 27.34 kg/m    Wt Readings from Last 3 Encounters:  12/28/23 196 lb (88.9 kg)  11/08/23 197 lb (89.4 kg)  09/28/23 197 lb 6.4 oz (89.5 kg)    GEN: Well nourished, well developed in no acute distress NECK: No JVD; No carotid bruits CARDIAC: RRR, no murmurs, rubs, gallops RESPIRATORY:  Clear to auscultation without rales, wheezing or rhonchi  ABDOMEN: Soft, non-tender, non-distended EXTREMITIES:  No edema; No deformity     ASSESSMENT AND PLAN: .    Right bundle branch block/LAFB: New finding on EKG 09/28/23. He was asymptomatic but due to significant family history and history of hyperlipidemia, he had exercise myoview  11/08/2023 which was low risk.  He had excellent exercise capacity with no angina.  Echocardiogram completed 10/20/2023 revealed normal LVEF 60 to 65%, G1 DD, mild LVH, no significant valve disease.  No concerning cardiac symptoms.  We will continue routine follow-up.  Dyslipidemia LDL goal < 70: Lipid panel 09/19/2023 with total cholesterol 163, triglycerides 400, HDL 31, LDL 69. Has been working to improve diet and increase exercise, but admits he could make additional improvements, especially with exercise. Encouraged dietary changes to limit saturated fat, sugar, processed foods, and simple carbohydrates.  Encouraged increased physical activity to achieve 150 minutes of moderate intensity exercise each week, particularly walking after meals for elevated triglycerides. He is tolerating medications without any concerning side effects.  We will continue fenofibrate , Vascepa , and Crestor . Will get repeat lipid panel today.   OSA on CPAP: He is compliant with CPAP but says it wakes him during the night due to discomfort.  We will schedule his follow-up with Dr. Micael Adas for sleep management.  Diabetes: A1c  improved to 6.5 on Jardiance.  He is very pleased with the improvement. He asks about adding GLP-1 agonist. I have encouraged him to work on lifestyle modification prior to adding additional medication. We will get A1c today.  Hypertension: BP is well-controlled. He has mild LVH on echo.  Encouraged continued monitoring of BP and low sodium diet.          Disposition: 1 year with Dr. Maximo Spar or APP  Signed, Slater Duncan, NP-C

## 2023-12-26 ENCOUNTER — Other Ambulatory Visit: Payer: Self-pay | Admitting: *Deleted

## 2023-12-26 ENCOUNTER — Telehealth: Payer: Self-pay | Admitting: *Deleted

## 2023-12-26 DIAGNOSIS — Z8249 Family history of ischemic heart disease and other diseases of the circulatory system: Secondary | ICD-10-CM

## 2023-12-26 DIAGNOSIS — E7800 Pure hypercholesterolemia, unspecified: Secondary | ICD-10-CM

## 2023-12-26 NOTE — Telephone Encounter (Signed)
-----   Message from Levi Aland sent at 12/24/2023 10:31 AM EST ----- Please call him and ask him to get NMR and CMET prior to appt on Wednesday. We had previously just ordered lipid panel but would like to change it to NMR.  Thanks!

## 2023-12-26 NOTE — Telephone Encounter (Signed)
 S/W pt is not aware to get labs prior. Pt will get fasting labs at upcoming ov with MSW on 12/28/23. Labs ordered and released in system. Labs added to upcoming appt notes.

## 2023-12-28 ENCOUNTER — Other Ambulatory Visit (HOSPITAL_BASED_OUTPATIENT_CLINIC_OR_DEPARTMENT_OTHER): Payer: Self-pay | Admitting: *Deleted

## 2023-12-28 ENCOUNTER — Encounter (HOSPITAL_BASED_OUTPATIENT_CLINIC_OR_DEPARTMENT_OTHER): Payer: Self-pay | Admitting: Nurse Practitioner

## 2023-12-28 ENCOUNTER — Ambulatory Visit (HOSPITAL_BASED_OUTPATIENT_CLINIC_OR_DEPARTMENT_OTHER): Payer: No Typology Code available for payment source | Admitting: Nurse Practitioner

## 2023-12-28 VITALS — BP 118/72 | HR 93 | Ht 71.0 in | Wt 196.0 lb

## 2023-12-28 DIAGNOSIS — E782 Mixed hyperlipidemia: Secondary | ICD-10-CM | POA: Diagnosis not present

## 2023-12-28 DIAGNOSIS — G4733 Obstructive sleep apnea (adult) (pediatric): Secondary | ICD-10-CM

## 2023-12-28 DIAGNOSIS — E7800 Pure hypercholesterolemia, unspecified: Secondary | ICD-10-CM

## 2023-12-28 DIAGNOSIS — E119 Type 2 diabetes mellitus without complications: Secondary | ICD-10-CM

## 2023-12-28 DIAGNOSIS — Z8249 Family history of ischemic heart disease and other diseases of the circulatory system: Secondary | ICD-10-CM

## 2023-12-28 DIAGNOSIS — I4510 Unspecified right bundle-branch block: Secondary | ICD-10-CM

## 2023-12-28 DIAGNOSIS — I447 Left bundle-branch block, unspecified: Secondary | ICD-10-CM

## 2023-12-28 DIAGNOSIS — I10 Essential (primary) hypertension: Secondary | ICD-10-CM

## 2023-12-28 NOTE — Patient Instructions (Signed)
 Medication Instructions:   Your physician recommends that you continue on your current medications as directed. Please refer to the Current Medication list given to you today.   *If you need a refill on your cardiac medications before your next appointment, please call your pharmacy*   Lab Work:  TODAY!!!!! NMR/A1C/CMET  If you have labs (blood work) drawn today and your tests are completely normal, you will receive your results only by: MyChart Message (if you have MyChart) OR A paper copy in the mail If you have any lab test that is abnormal or we need to change your treatment, we will call you to review the results.   Testing/Procedures:  None ordered.   Follow-Up: At [facility], you and your health needs are our priority.  As part of our continuing mission to provide you with exceptional heart care, we have created designated Provider Care Teams.  These Care Teams include your primary Cardiologist (physician) and Advanced Practice Providers (APPs -  Physician Assistants and Nurse Practitioners) who all work together to provide you with the care you need, when you need it.  We recommend signing up for the patient portal called "MyChart".  Sign up information is provided on this After Visit Summary.  MyChart is used to connect with patients for Virtual Visits (Telemedicine).  Patients are able to view lab/test results, encounter notes, upcoming appointments, etc.  Non-urgent messages can be sent to your provider as well.   To learn more about what you can do with MyChart, go to [email or web address].    Your next appointment:   1 year(s)  Provider:   K. Italy Hilty, MD or Slater Duncan, NP    Other Instructions  Keep telephone sleep appt.       \

## 2023-12-29 ENCOUNTER — Other Ambulatory Visit: Payer: Self-pay | Admitting: *Deleted

## 2023-12-29 DIAGNOSIS — E7800 Pure hypercholesterolemia, unspecified: Secondary | ICD-10-CM

## 2023-12-29 DIAGNOSIS — Z8249 Family history of ischemic heart disease and other diseases of the circulatory system: Secondary | ICD-10-CM

## 2023-12-29 LAB — COMPREHENSIVE METABOLIC PANEL
ALT: 50 [IU]/L — ABNORMAL HIGH (ref 0–44)
AST: 34 [IU]/L (ref 0–40)
Albumin: 5.1 g/dL — ABNORMAL HIGH (ref 3.8–4.9)
Alkaline Phosphatase: 66 [IU]/L (ref 44–121)
BUN/Creatinine Ratio: 30 — ABNORMAL HIGH (ref 9–20)
BUN: 21 mg/dL (ref 6–24)
Bilirubin Total: 0.5 mg/dL (ref 0.0–1.2)
CO2: 21 mmol/L (ref 20–29)
Calcium: 9.9 mg/dL (ref 8.7–10.2)
Chloride: 99 mmol/L (ref 96–106)
Creatinine, Ser: 0.69 mg/dL — ABNORMAL LOW (ref 0.76–1.27)
Globulin, Total: 2.7 g/dL (ref 1.5–4.5)
Glucose: 112 mg/dL — ABNORMAL HIGH (ref 70–99)
Potassium: 4.5 mmol/L (ref 3.5–5.2)
Sodium: 139 mmol/L (ref 134–144)
Total Protein: 7.8 g/dL (ref 6.0–8.5)
eGFR: 109 mL/min/{1.73_m2} (ref >59)

## 2023-12-29 LAB — NMR, LIPOPROFILE
Cholesterol, Total: 189 mg/dL (ref 100–199)
HDL Particle Number: 31.5 umol/L (ref >=30.5)
HDL-C: 27 mg/dL — ABNORMAL LOW (ref >39)
LDL Particle Number: 820 nmol/L (ref <1000)
LDL Size: 19.3 nmol — ABNORMAL LOW (ref >20.5)
LDL-C (NIH Calc): 49 mg/dL (ref 0–99)
LP-IR Score: 85 — ABNORMAL HIGH (ref <=45)
Small LDL Particle Number: 718 nmol/L — ABNORMAL HIGH (ref <=527)
Triglycerides: 791 mg/dL (ref 0–149)

## 2023-12-29 LAB — HEMOGLOBIN A1C
Est. average glucose Bld gHb Est-mCnc: 154 mg/dL
Hgb A1c MFr Bld: 7 % — ABNORMAL HIGH (ref 4.8–5.6)

## 2024-01-22 ENCOUNTER — Other Ambulatory Visit: Payer: Self-pay | Admitting: Internal Medicine

## 2024-01-24 ENCOUNTER — Encounter (HOSPITAL_BASED_OUTPATIENT_CLINIC_OR_DEPARTMENT_OTHER): Payer: Self-pay | Admitting: Internal Medicine

## 2024-01-24 MED ORDER — FENOFIBRIC ACID 135 MG PO CPDR: 1.0000 | DELAYED_RELEASE_CAPSULE | Freq: Every day | ORAL | 3 refills | Status: DC

## 2024-02-09 LAB — TRIGLYCERIDES: Triglycerides: 460 mg/dL — ABNORMAL HIGH (ref 0–149)

## 2024-02-10 ENCOUNTER — Encounter (HOSPITAL_BASED_OUTPATIENT_CLINIC_OR_DEPARTMENT_OTHER): Payer: Self-pay

## 2024-02-13 NOTE — Progress Notes (Unsigned)
 Virtual Visit via Video Note   This visit type was conducted due to national recommendations for restrictions regarding the COVID-19 Pandemic (e.g. social distancing) in an effort to limit this patient's exposure and mitigate transmission in our community.  Due to his co-morbid illnesses, this patient is at least at moderate risk for complications without adequate follow up.  This format is felt to be most appropriate for this patient at this time.  The patient did not have access to video technology/had technical difficulties with video requiring transitioning to audio format only (telephone).  All issues noted in this document were discussed and addressed.  A physical exam was performed with this format.  Please refer to the patient's chart for his  consent to telehealth for [facility].   Evaluation Performed:  Follow-up visit  Date:  02/14/2024   ID:  Allen Thomas, DOB 06-14-1966, MRN [id number]  Patient Location:  Home  Provider location:   [place]  PCP:  Soundra Pilon, FNP  Cardiologist:  Chrystie Nose, MD  Sleep Medicine:  Armanda Magic, MD Electrophysiologist:  None   Chief Complaint:  OSA  History of Present Illness:    Allen Thomas is a 58 y.o. male who presents via audio/video conferencing for a telehealth visit today.    [name] is a 58y.o. male who presents for followup of OSA.  He is doing well with his PAP device and thinks that he has gotten used to it.  He tolerates the under the nose full face mask and feels the pressure is adequate.  Since going on PAP he feels rested in the am and has no significant daytime sleepiness.  He denies any significant mouth or nasal dryness or nasal congestion.  He does not think that he snores.    Prior CV studies:   The following studies were reviewed today:  PAP compliance download on Airview  Past Medical History:  Diagnosis Date   Anxiety    Depression    Diabetes mellitus    Type II   Elevated LFTs     Secondary to fatty liver   History of tobacco abuse    Hypertension    Mixed dyslipidemia    Obesity    Psoriasis    followed by Dermatologist Dr Yetta Barre   Sleep apnea    settings at 11    Tinnitus 2009   followed by ENT Dr Pollyann Kennedy   Past Surgical History:  Procedure Laterality Date   BACK SURGERY     L5-S1 back surgery    CARDIAC CATHETERIZATION  2004   normal coronary arteries   KNEE ARTHROSCOPY  07/27/2012   Procedure: ARTHROSCOPY KNEE;  Surgeon: Kathryne Hitch, MD;  Location: [facility];  Service: Orthopedics;  Laterality: Left;  Left knee arthroscopy with debridement and medial menisectomy     Current Meds  Medication Sig   betamethasone dipropionate (DIPROLENE) 0.05 % ointment Apply 1 application topically daily.   Choline Fenofibrate (FENOFIBRIC ACID) 135 MG CPDR Take 1 capsule by mouth daily at 6 (six) AM.   Coal Tar Extract 1.25 % GEL Apply topically as needed. For psoriasis on elbows   hydrocortisone 2.5 % cream Apply topically 2 (two) times daily.   icosapent Ethyl (VASCEPA) 1 g capsule TAKE 2 CAPSULES BY MOUTH 2 TIMES DAILY.   JARDIANCE 25 MG TABS tablet Take 25 mg by mouth daily.   lisinopril (ZESTRIL) 20 MG tablet Take 20 mg by mouth daily.   metFORMIN (GLUCOPHAGE)  500 MG tablet Take 1,000 mg by mouth 2 (two) times daily with a meal.    PARoxetine (PAXIL) 40 MG tablet Take 40 mg by mouth every evening.    pioglitazone (ACTOS) 15 MG tablet TAKE 1 TABLET BY MOUTH EVERY DAY   rosuvastatin (CRESTOR) 40 MG tablet TAKE 1 TABLET BY MOUTH EVERY DAY     Allergies:   Patient has no known allergies.   Social History   Tobacco Use   Smoking status: Former    Current packs/day: 0.00    Types: Cigarettes    Quit date: 12/14/2003    Years since quitting: 20.1   Smokeless tobacco: Former  Substance Use Topics   Alcohol use: Yes    Comment: rare   Drug use: No     Family Hx: The patient's family history includes CAD in his mother; Stroke in his brother.  ROS:    Please see the history of present illness.     All other systems reviewed and are negative.   Labs/Other Tests and Data Reviewed:    Recent Labs: 12/28/2023: ALT 50; BUN 21; Creatinine, Ser 0.69; Potassium 4.5; Sodium 139   Recent Lipid Panel Lab Results  Component Value Date/Time   CHOL 163 09/19/2023 07:29 AM   TRIG 460 (H) 02/09/2024 07:24 AM   HDL 31 (L) 09/19/2023 07:29 AM   CHOLHDL 5.3 (H) 09/19/2023 07:29 AM   LDLCALC 69 09/19/2023 07:29 AM   LDLDIRECT 67 08/04/2020 09:10 AM    Wt Readings from Last 3 Encounters:  02/14/24 195 lb (88.5 kg)  12/28/23 196 lb (88.9 kg)  11/08/23 197 lb (89.4 kg)     Objective:    Vital Signs:  BP 129/74   Pulse 79   Ht 5\' 11"  (1.803 m)   Wt 195 lb (88.5 kg)   BMI 27.20 kg/m   Well nourished, well developed male in no acute distress. Well appearing, alert and conversant, regular work of breathing,  good skin color  Eyes- anicteric mouth- oral mucosa is pink  neuro- grossly intact skin- no apparent rash or lesions or cyanosis ASSESSMENT & PLAN:    OSA - The patient is tolerating PAP therapy well without any problems. The PAP download performed by his DME was personally reviewed and interpreted by me today and showed an AHI of 5.2 /hr on CPAP at 15.2 cm H2O with 83 % compliance in using more than 4 hours nightly.  The patient has been using and benefiting from PAP use and will continue to benefit from therapy.   HTN -BP has been controlled at home ranging from 115-125/74-13mmHg -Continue drug management with lisinopril 20 mg daily with as needed refills  Time:   Today, I have spent 15 minutes on telemedicine discussing medical problems including OSA< HTN, obesity and reviewing patient's chart including PAP compliance on airview.  Medication Adjustments/Labs and Tests Ordered: Current medicines are reviewed at length with the patient today.  Concerns regarding medicines are outlined above.  Tests Ordered: No orders of the  defined types were placed in this encounter.  Medication Changes: No orders of the defined types were placed in this encounter.   Disposition:  Follow up in 1 year(s)  Signed, Armanda Magic, MD  02/14/2024 8:10 AM    [facility]

## 2024-02-14 ENCOUNTER — Ambulatory Visit: Payer: No Typology Code available for payment source | Attending: Nurse Practitioner | Admitting: Cardiology

## 2024-02-14 VITALS — BP 129/74 | HR 79 | Ht 71.0 in | Wt 195.0 lb

## 2024-02-14 DIAGNOSIS — I10 Essential (primary) hypertension: Secondary | ICD-10-CM | POA: Diagnosis not present

## 2024-02-14 DIAGNOSIS — G4733 Obstructive sleep apnea (adult) (pediatric): Secondary | ICD-10-CM | POA: Diagnosis not present

## 2024-02-14 NOTE — Patient Instructions (Signed)
 Medication Instructions:   *If you need a refill on your cardiac medications before your next appointment, please call your pharmacy*   Lab Work:  If you have labs (blood work) drawn today and your tests are completely normal, you will receive your results only by: MyChart Message (if you have MyChart) OR A paper copy in the mail If you have any lab test that is abnormal or we need to change your treatment, we will call you to review the results.   Testing/Procedures:    Follow-Up: At [facility], you and your health needs are our priority.  As part of our continuing mission to provide you with exceptional heart care, we have created designated Provider Care Teams.  These Care Teams include your primary Cardiologist (physician) and Advanced Practice Providers (APPs -  Physician Assistants and Nurse Practitioners) who all work together to provide you with the care you need, when you need it.  We recommend signing up for the patient portal called "MyChart".  Sign up information is provided on this After Visit Summary.  MyChart is used to connect with patients for Virtual Visits (Telemedicine).  Patients are able to view lab/test results, encounter notes, upcoming appointments, etc.  Non-urgent messages can be sent to your provider as well.   To learn more about what you can do with MyChart, go to [email or web address].    Your next appointment:   1 year(s)  Provider:   DR Armanda Magic     Other Instructions

## 2024-03-10 ENCOUNTER — Ambulatory Visit
Admission: EM | Admit: 2024-03-10 | Discharge: 2024-03-10 | Disposition: A | Discharge status: Home / Self Care | Attending: Family Medicine | Admitting: Family Medicine

## 2024-03-10 DIAGNOSIS — E119 Type 2 diabetes mellitus without complications: Secondary | ICD-10-CM | POA: Diagnosis not present

## 2024-03-10 DIAGNOSIS — J309 Allergic rhinitis, unspecified: Secondary | ICD-10-CM

## 2024-03-10 DIAGNOSIS — J069 Acute upper respiratory infection, unspecified: Secondary | ICD-10-CM | POA: Diagnosis not present

## 2024-03-10 DIAGNOSIS — Z7984 Long term (current) use of oral hypoglycemic drugs: Secondary | ICD-10-CM | POA: Diagnosis not present

## 2024-03-10 LAB — POC COVID19/FLU A&B COMBO
Covid Antigen, POC: NEGATIVE
Influenza A Antigen, POC: NEGATIVE
Influenza B Antigen, POC: NEGATIVE

## 2024-03-10 MED ORDER — PREDNISONE 10 MG PO TABS: 30.0000 mg | ORAL_TABLET | Freq: Every day | ORAL | 0 refills | Status: DC

## 2024-03-10 NOTE — Discharge Instructions (Addendum)
 We will manage this as a viral syndrome. For sore throat or cough try using a honey-based tea. Use 3 teaspoons of honey with juice squeezed from half lemon. Place shaved pieces of ginger into 1/2-1 cup of water and warm over stove top. Then mix the ingredients and repeat every 4 hours as needed. Please take Tylenol 500mg -650mg  once every 6 hours for fevers, aches and pains. Hydrate very well with at least 2 liters (64 ounces) of water. Eat light meals such as soups (chicken and noodles, chicken wild rice, vegetable).  Do not eat any foods that you are allergic to.  Start an antihistamine like Zyrtec (10mg  daily) for postnasal drainage, sinus congestion.  You can take this together with prednisone for your sinuses and allergies.

## 2024-03-10 NOTE — ED Provider Notes (Signed)
 [facility]  Note:  This document was prepared using Conservation officer, historic buildings and may include unintentional dictation errors.  MRN: [id number] DOB: [birth year]  Subjective:   Allen Thomas is a 58 y.o. male presenting for 1 day history of sinus headache, sinus congestion and drainage, bilateral ear fullness, neck pain, upper back pain.  No cough, chest pain, shortness of breath or wheezing.  No rashes.  No confusion, weakness, numbness or tingling.  No neck stiffness.  Patient does have a history of bulging and protruding disks.  Has type 2 diabetes, treated without insulin.  No current facility-administered medications for this encounter.  Current Outpatient Medications:    betamethasone dipropionate (DIPROLENE) 0.05 % ointment, Apply 1 application topically daily., Disp: , Rfl:    Choline Fenofibrate (FENOFIBRIC ACID) 135 MG CPDR, Take 1 capsule by mouth daily at 6 (six) AM., Disp: 90 capsule, Rfl: 3   Coal Tar Extract 1.25 % GEL, Apply topically as needed. For psoriasis on elbows, Disp: , Rfl:    hydrocortisone 2.5 % cream, Apply topically 2 (two) times daily., Disp: , Rfl:    icosapent Ethyl (VASCEPA) 1 g capsule, TAKE 2 CAPSULES BY MOUTH 2 TIMES DAILY., Disp: 360 capsule, Rfl: 3   JARDIANCE 25 MG TABS tablet, Take 25 mg by mouth daily., Disp: , Rfl:    lisinopril (ZESTRIL) 20 MG tablet, Take 20 mg by mouth daily., Disp: , Rfl:    metFORMIN (GLUCOPHAGE) 500 MG tablet, Take 1,000 mg by mouth 2 (two) times daily with a meal. , Disp: , Rfl:    PARoxetine (PAXIL) 40 MG tablet, Take 40 mg by mouth every evening. , Disp: , Rfl:    pioglitazone (ACTOS) 15 MG tablet, TAKE 1 TABLET BY MOUTH EVERY DAY, Disp: 90 tablet, Rfl: 3   rosuvastatin (CRESTOR) 40 MG tablet, TAKE 1 TABLET BY MOUTH EVERY DAY, Disp: 90 tablet, Rfl: 1   No Known Allergies  Past Medical History:  Diagnosis Date   Anxiety    Depression    Diabetes mellitus    Type II   Elevated LFTs     Secondary to fatty liver   History of tobacco abuse    Hypertension    Mixed dyslipidemia    Obesity    Psoriasis    followed by Dermatologist Dr Yetta Barre   Sleep apnea    settings at 11    Tinnitus 2009   followed by ENT Dr Pollyann Kennedy     Past Surgical History:  Procedure Laterality Date   BACK SURGERY     L5-S1 back surgery    CARDIAC CATHETERIZATION  2004   normal coronary arteries   KNEE ARTHROSCOPY  07/27/2012   Procedure: ARTHROSCOPY KNEE;  Surgeon: Kathryne Hitch, MD;  Location: [facility];  Service: Orthopedics;  Laterality: Left;  Left knee arthroscopy with debridement and medial menisectomy    Family History  Problem Relation Age of Onset   CAD Mother    Stroke Brother     Social History   Tobacco Use   Smoking status: Former    Current packs/day: 0.00    Types: Cigarettes    Quit date: 12/14/2003    Years since quitting: 20.2   Smokeless tobacco: Former  Substance Use Topics   Alcohol use: Yes    Comment: rare   Drug use: No    ROS   Objective:   Vitals: BP 127/78 (BP Location: Left Arm)   Pulse 92   Temp  97.8 F (36.6 C) (Oral)   Resp 16   SpO2 95%   Physical Exam Constitutional:      General: He is not in acute distress.    Appearance: Normal appearance. He is well-developed and normal weight. He is not ill-appearing, toxic-appearing or diaphoretic.  HENT:     Head: Normocephalic and atraumatic.     Right Ear: Tympanic membrane, ear canal and external ear normal. No drainage, swelling or tenderness. No middle ear effusion. There is no impacted cerumen. Tympanic membrane is not erythematous or bulging.     Left Ear: Tympanic membrane, ear canal and external ear normal. No drainage, swelling or tenderness.  No middle ear effusion. There is no impacted cerumen. Tympanic membrane is not erythematous or bulging.     Nose: Nose normal. No congestion or rhinorrhea.     Mouth/Throat:     Mouth: Mucous membranes are moist.     Pharynx: Oropharynx is  clear. No oropharyngeal exudate or posterior oropharyngeal erythema.  Eyes:     General: No scleral icterus.       Right eye: No discharge.        Left eye: No discharge.     Extraocular Movements: Extraocular movements intact.     Conjunctiva/sclera: Conjunctivae normal.  Neck:     Meningeal: Brudzinski's sign and Kernig's sign absent.  Cardiovascular:     Rate and Rhythm: Normal rate.  Pulmonary:     Effort: Pulmonary effort is normal.  Musculoskeletal:     Cervical back: Normal range of motion and neck supple. No rigidity. No muscular tenderness.  Neurological:     General: No focal deficit present.     Mental Status: He is alert and oriented to person, place, and time.     Cranial Nerves: No cranial nerve deficit, dysarthria or facial asymmetry.     Motor: No weakness, abnormal muscle tone or pronator drift.     Coordination: Romberg sign negative. Coordination normal. Finger-Nose-Finger Test and Heel to [facility] Test normal. Rapid alternating movements normal.     Gait: Gait normal.     Deep Tendon Reflexes: Reflexes normal.  Psychiatric:        Mood and Affect: Mood normal.        Behavior: Behavior normal.        Thought Content: Thought content normal.        Judgment: Judgment normal.     Results for orders placed or performed during the hospital encounter of 03/10/24 (from the past 24 hours)  POC Covid19/Flu A&B Antigen     Status: Normal   Collection Time: 03/10/24 10:09 AM  Result Value Ref Range   Influenza A Antigen, POC Negative    Influenza B Antigen, POC Negative    Covid Antigen, POC Negative      Assessment and Plan :   PDMP not reviewed this encounter.  1. Viral upper respiratory infection   2. Type 2 diabetes mellitus treated without insulin (HCC)   3. Allergic rhinitis, unspecified seasonality, unspecified trigger    Low suspicion for an acute encephalopathy, meningitis, acute spinal emergency.  I do recommend using prednisone in the context of his  severe sinus symptoms, allergic rhinitis exacerbation.  Suspect viral URI, viral syndrome. Physical exam findings reassuring and vital signs stable for discharge. Advised supportive care, offered symptomatic relief. Counseled patient on potential for adverse effects with medications prescribed/recommended today, ER and return-to-clinic precautions discussed, patient verbalized understanding.     Wallis Bamberg, PA-C 03/10/24 1155

## 2024-03-10 NOTE — ED Triage Notes (Signed)
 Neck pain,Fatigue and fever started over night. Patient has been taking Motrin for comfort.

## 2024-05-30 ENCOUNTER — Ambulatory Visit (INDEPENDENT_AMBULATORY_CARE_PROVIDER_SITE_OTHER): Admitting: Orthopaedic Surgery

## 2024-05-30 ENCOUNTER — Other Ambulatory Visit (INDEPENDENT_AMBULATORY_CARE_PROVIDER_SITE_OTHER): Payer: Self-pay

## 2024-05-30 DIAGNOSIS — M25562 Pain in left knee: Secondary | ICD-10-CM

## 2024-05-30 DIAGNOSIS — G8929 Other chronic pain: Secondary | ICD-10-CM

## 2024-05-30 NOTE — Progress Notes (Signed)
 The patient is a 58 year old gentleman that I have seen remotely in the past.  Back in 2013 we performed a left knee arthroscopy with a partial medial meniscectomy and removal of plica.  He has done well for a very long period time until December of this past year he developed swelling in his knee with locking and catching.  Pivoting activities cause a lot of pain on the medial aspect of his left knee and has been getting slowly worse.  He says the knee feels like some give way.  On examination of his left knee he has a positive McMurray's sign to the medial compartment of his left knee.  There is slight varus malalignment is correctable.  Today there is no effusion but he has good range of motion but pain significant on the medial aspect of his left knee past 90 degrees of flexion.  2 views of the left knee shows slight medial joint space narrowing that is worsened over the last decade.  There is a slight irregular surface of the medial femoral condyle and the patellofemoral joint shows narrowing.  Given his locking catching and mechanical symptoms of his left knee combined with his positive Murray's exam and radiographic findings, a MRI is medically warranted of the left knee to assess the cartilage and to rule out a new meniscal tear of the left knee.  He agrees with this treatment plan.  Will see him back in follow-up once we have the MRI of his left knee and then can go from there in terms of further treatment.

## 2024-05-31 ENCOUNTER — Encounter: Payer: Self-pay | Admitting: Orthopaedic Surgery

## 2024-05-31 NOTE — Addendum Note (Signed)
 Addended by: Clancy Crimes B on: 05/31/2024 08:16 AM   Modules accepted: Orders

## 2024-06-09 ENCOUNTER — Ambulatory Visit
Admission: RE | Admit: 2024-06-09 | Discharge: 2024-06-09 | Disposition: A | Discharge status: Home / Self Care | Source: Ambulatory Visit | Attending: Orthopaedic Surgery | Admitting: Orthopaedic Surgery

## 2024-06-09 DIAGNOSIS — G8929 Other chronic pain: Secondary | ICD-10-CM

## 2024-06-25 ENCOUNTER — Ambulatory Visit: Admitting: Orthopaedic Surgery

## 2024-07-02 ENCOUNTER — Ambulatory Visit (INDEPENDENT_AMBULATORY_CARE_PROVIDER_SITE_OTHER): Admitting: Orthopaedic Surgery

## 2024-07-02 ENCOUNTER — Encounter: Payer: Self-pay | Admitting: Orthopaedic Surgery

## 2024-07-02 DIAGNOSIS — M1712 Unilateral primary osteoarthritis, left knee: Secondary | ICD-10-CM

## 2024-07-02 DIAGNOSIS — M25562 Pain in left knee: Secondary | ICD-10-CM

## 2024-07-02 DIAGNOSIS — G8929 Other chronic pain: Secondary | ICD-10-CM | POA: Diagnosis not present

## 2024-07-02 NOTE — Progress Notes (Signed)
 The patient is a 58 year old gentleman well-known to me.  He comes in today to go over MRI of his left knee.  He does have a remote history of a left knee arthroscopy with a partial medial meniscectomy.  He was having worsening medial pain with that knee especially with pivoting activities and had tried and failed conservative treatment.  He cannot tolerate steroid injections due to the effect on his blood glucose.  Examination of his left knee shows mainly medial joint line tenderness with some varus malalignment that is mild but easily correctable.  His knee is ligamentously stable with the only irritation on the medial joint line when I pivot the tibia on the femur.  His flexion and extension are full.  MRI of his left knee shows a small area of full-thickness cartilage loss of the weightbearing surface and some reactive edema.  There is significant degenerative changes of the meniscus but no discrete tear.  The remainder of his knee appears normal.   At this point I do feel it is worth him trying a one-time hyaluronic acid injection in the knee to treat the pain from the arthritis aspect of his knee given the small full-thickness cartilage loss.  We will see if we can get this ordered and approved for him and then go from there in terms of further treatment.  He may be eventually candidate for a partial knee replacement but it certainly worth hyaluronic acid to try first.  This patient is diagnosed with osteoarthritis of the knee(s).    Radiographs show evidence of joint space narrowing, osteophytes, subchondral sclerosis and/or subchondral cysts.  This patient has knee pain which interferes with functional and activities of daily living.    This patient has experienced inadequate response, adverse effects and/or intolerance with conservative treatments such as acetaminophen , NSAIDS, topical creams, physical therapy or regular exercise, knee bracing and/or weight loss.   This patient has  experienced inadequate response or has a contraindication to intra articular steroid injections for at least 3 months.   This patient is not scheduled to have a total knee replacement within 6 months of starting treatment with viscosupplementation.

## 2024-07-03 ENCOUNTER — Telehealth: Payer: Self-pay | Admitting: Radiology

## 2024-07-03 NOTE — Telephone Encounter (Signed)
 Left knee hyaluronic acid

## 2024-07-11 NOTE — Telephone Encounter (Signed)
 VOB submitted for Monovisc, left knee

## 2024-07-25 ENCOUNTER — Encounter: Payer: Self-pay | Admitting: Orthopaedic Surgery

## 2024-07-26 ENCOUNTER — Other Ambulatory Visit: Payer: Self-pay

## 2024-07-26 DIAGNOSIS — M1712 Unilateral primary osteoarthritis, left knee: Secondary | ICD-10-CM

## 2024-08-19 ENCOUNTER — Other Ambulatory Visit: Payer: Self-pay

## 2024-08-19 ENCOUNTER — Ambulatory Visit
Admission: EM | Admit: 2024-08-19 | Discharge: 2024-08-19 | Disposition: A | Discharge status: Home / Self Care | Attending: Family Medicine | Admitting: Family Medicine

## 2024-08-19 DIAGNOSIS — R051 Acute cough: Secondary | ICD-10-CM | POA: Diagnosis not present

## 2024-08-19 DIAGNOSIS — U071 COVID-19: Secondary | ICD-10-CM | POA: Diagnosis not present

## 2024-08-19 DIAGNOSIS — Z20822 Contact with and (suspected) exposure to covid-19: Secondary | ICD-10-CM | POA: Diagnosis not present

## 2024-08-19 HISTORY — DX: Obstructive sleep apnea (adult) (pediatric): G47.33

## 2024-08-19 LAB — POC SOFIA SARS ANTIGEN FIA: SARS Coronavirus 2 Ag: POSITIVE — AB

## 2024-08-19 MED ORDER — MOLNUPIRAVIR EUA 200MG CAPSULE: 4.0000 | ORAL_CAPSULE | Freq: Two times a day (BID) | ORAL | 0 refills | Status: AC

## 2024-08-19 MED ORDER — BENZONATATE 200 MG PO CAPS: 200.0000 mg | ORAL_CAPSULE | Freq: Three times a day (TID) | ORAL | 0 refills | Status: DC | PRN

## 2024-08-19 NOTE — ED Triage Notes (Signed)
 Pt states wife tested positive for COVID on Friday. Pt c/o HA, nasal drainage, chest congestion, ear fulness bilat, productive cough started yesterday

## 2024-08-19 NOTE — ED Provider Notes (Signed)
 [facility]    CSN: [id number] Arrival date & time: 08/19/24  0801      History   Chief Complaint No chief complaint on file.   HPI Allen Thomas is a 58 y.o. male  presents for evaluation of URI symptoms for 2 days. Patient reports associated symptoms of cough, congestion, body aches, headache, fevers, ear fullness. Denies N/V/D, sore throat, shortness of breath. Patient does not have a hx of asthma. Patient is not an active smoker.   Reports wife tested positive for COVID a few days ago.  Pt has taken Tylenol  in ibuprofen OTC for symptoms.  States he has had COVID in the past without hospitalization or complication.  Pt has no other concerns at this time.   HPI  Past Medical History:  Diagnosis Date   Anxiety    Depression    Diabetes mellitus    Type II   Elevated LFTs    Secondary to fatty liver   History of tobacco abuse    Hypertension    Mixed dyslipidemia    Obesity    OSA (obstructive sleep apnea)    Psoriasis    followed by Dermatologist Dr Joshua   Sleep apnea    settings at 11    Tinnitus 12/14/2007   followed by ENT Dr Jesus    Patient Active Problem List   Diagnosis Date Noted   Pure hypercholesterolemia    Atypical chest pain 10/07/2015   Obstructive sleep apnea 12/07/2013   Essential hypertension 12/07/2013   Obesity (BMI 30-39.9) 12/07/2013   Family history of early CAD 12/07/2013   Internal derangement of knee joint 07/27/2012    Past Surgical History:  Procedure Laterality Date   BACK SURGERY     L5-S1 back surgery    CARDIAC CATHETERIZATION  2004   normal coronary arteries   KNEE ARTHROSCOPY  07/27/2012   Procedure: ARTHROSCOPY KNEE;  Surgeon: Lonni CINDERELLA Poli, MD;  Location: [facility];  Service: Orthopedics;  Laterality: Left;  Left knee arthroscopy with debridement and medial menisectomy       Home Medications    Prior to Admission medications   Medication Sig Start Date End Date Taking? Authorizing Provider   benzonatate  (TESSALON ) 200 MG capsule Take 1 capsule (200 mg total) by mouth 3 (three) times daily as needed. 08/19/24  Yes Leiam Hopwood, Jodi R, NP  molnupiravir  EUA (LAGEVRIO ) 200 mg CAPS capsule Take 4 capsules (800 mg total) by mouth 2 (two) times daily for 5 days. 08/19/24 08/24/24 Yes Adriann Ballweg, Jodi R, NP  betamethasone dipropionate (DIPROLENE) 0.05 % ointment Apply 1 application topically daily.    [provider]  Choline Fenofibrate  (FENOFIBRIC ACID ) 135 MG CPDR Take 1 capsule by mouth daily at 6 (six) AM. 01/24/24   Hilty, Vinie BROCKS, MD  Coal Tar Extract 1.25 % GEL Apply topically as needed. For psoriasis on elbows    [provider]  hydrocortisone 2.5 % cream Apply topically 2 (two) times daily. 12/18/21   [provider]  icosapent  Ethyl (VASCEPA ) 1 g capsule TAKE 2 CAPSULES BY MOUTH 2 TIMES DAILY. 10/13/23   Hilty, Vinie BROCKS, MD  JARDIANCE 25 MG TABS tablet Take 25 mg by mouth daily. 01/06/22   [provider]  lisinopril (ZESTRIL) 20 MG tablet Take 20 mg by mouth daily. 12/08/21   [provider]  metFORMIN (GLUCOPHAGE) 500 MG tablet Take 1,000 mg by mouth 2 (two) times daily with a meal.     [provider]  PARoxetine (PAXIL) 40 MG tablet Take 40 mg by mouth every evening.     [provider]  pioglitazone (ACTOS) 15 MG tablet TAKE 1 TABLET BY MOUTH EVERY DAY 03/02/21   Hilty, Vinie BROCKS, MD  predniSONE  (DELTASONE ) 10 MG tablet Take 3 tablets (30 mg total) by mouth daily with breakfast. 03/10/24   Christopher Savannah, PA-C  rosuvastatin  (CRESTOR ) 40 MG tablet TAKE 1 TABLET BY MOUTH EVERY DAY 03/16/21   Hilty, Vinie BROCKS, MD    Family History Family History  Problem Relation Age of Onset   CAD Mother    Stroke Brother     Social History Social History   Tobacco Use   Smoking status: Former    Current packs/day: 0.00    Types: Cigarettes    Quit date: 12/14/2003    Years since quitting: 20.6   Smokeless tobacco: Former  Substance Use  Topics   Alcohol use: Yes    Comment: rare   Drug use: No     Allergies   Patient has no known allergies.   Review of Systems Review of Systems  Constitutional:  Positive for fever.  HENT:  Positive for congestion and ear pain.   Respiratory:  Positive for cough.   Musculoskeletal:  Positive for myalgias.  Neurological:  Positive for headaches.     Physical Exam Triage Vital Signs ED Triage Vitals  Encounter Vitals Group     BP 08/19/24 0815 122/79     Girls Systolic BP Percentile --      Girls Diastolic BP Percentile --      Boys Systolic BP Percentile --      Boys Diastolic BP Percentile --      Pulse Rate 08/19/24 0815 (!) 104     Resp 08/19/24 0815 17     Temp 08/19/24 0815 100.1 F (37.8 C)     Temp Source 08/19/24 0815 Oral     SpO2 08/19/24 0815 94 %     Weight --      Height --      Head Circumference --      Peak Flow --      Pain Score 08/19/24 0813 5     Pain Loc --      Pain Education --      Exclude from Growth Chart --    No data found.  Updated Vital Signs BP 122/79   Pulse (!) 104   Temp 100.1 F (37.8 C) (Oral)   Resp 17   SpO2 94%   Visual Acuity Right Eye Distance:   Left Eye Distance:   Bilateral Distance:    Right Eye Near:   Left Eye Near:    Bilateral Near:     Physical Exam Vitals and nursing note reviewed.  Constitutional:      General: He is not in acute distress.    Appearance: Normal appearance. He is not ill-appearing or toxic-appearing.  HENT:     Head: Normocephalic and atraumatic.     Right Ear: Tympanic membrane and ear canal normal.     Left Ear: Tympanic membrane and ear canal normal.     Nose: Congestion present.     Mouth/Throat:     Mouth: Mucous membranes are moist.     Pharynx: No oropharyngeal exudate or posterior oropharyngeal erythema.  Eyes:     Pupils: Pupils are equal, round, and reactive to light.  Cardiovascular:     Rate and Rhythm: Normal rate and regular rhythm.  Heart sounds: Normal  heart sounds.  Pulmonary:     Effort: Pulmonary effort is normal.     Breath sounds: Normal breath sounds. No wheezing or rhonchi.  Musculoskeletal:     Cervical back: Normal range of motion and neck supple.  Lymphadenopathy:     Cervical: No cervical adenopathy.  Skin:    General: Skin is warm and dry.  Neurological:     General: No focal deficit present.     Mental Status: He is alert and oriented to person, place, and time.  Psychiatric:        Mood and Affect: Mood normal.        Behavior: Behavior normal.      UC Treatments / Results  Labs (all labs ordered are listed, but only abnormal results are displayed) Labs Reviewed  POC SOFIA SARS ANTIGEN FIA - Abnormal; Notable for the following components:      Result Value   SARS Coronavirus 2 Ag Positive (*)    All other components within normal limits   Comp Met (CMET) Order: [id number]  Status: Final result     Next appt: 08/23/2024 at 04:00 PM in Orthopedics Ananias CINDERELLA Poli, MD)     Dx: Pure hypercholesterolemia; Family his...   Test Result Released: Yes (seen)     Messages: Seen   3 Result Notes     1 Patient Communication     1 HM Topic          Component Ref Range & Units (hover) 7 mo ago (12/28/23) 11 mo ago (09/19/23) 1 yr ago (07/22/23) 4 yr ago (08/04/20) 4 yr ago (12/21/19) 4 yr ago (09/07/19) 10 yr ago (10/03/13)  Glucose 112 High      134 High  148 High   BUN 21     14 R 11 R  Creatinine, Ser 0.69 Low      0.68 R 0.66 R  eGFR 109  108.0 R, CM      BUN/Creatinine Ratio 30 High         Sodium 139     139 R 139 R  Potassium 4.5     3.8 R 3.8 R  Chloride 99     107 R 104 R  CO2 21     23 R 25 R  Calcium  9.9     9.6 R 9.9 R  Total Protein 7.8   7.2 6.9    Albumin 5.1 High    4.9 4.6    Globulin, Total 2.7        Bilirubin Total 0.5   0.2 0.7    Alkaline Phosphatase 66   65 R, CM 73 R    AST 34   57 High  38    ALT 50 High  43  56 High  51 High     Resulting Agency [facility]         Narrative Performed by: HOYT Performed at:  [address] [facility] [address], [place], [place]  [id number] Lab Director: Frankey Sas MD, Phone:  [phone]  Specimen Collected: 12/28/23 16:05 Last Resulted: 12/29/23 00:35   EKG   Radiology No results found.  Procedures Procedures (including critical care time)  Medications Ordered in UC Medications - No data to display  Initial Impression / Assessment and Plan / UC Course  I have reviewed the triage vital signs and the nursing notes.  Pertinent labs & imaging results  that were available during my care of the patient were reviewed by me and considered in my medical decision making (see chart for details).     Reviewed exam and symptoms with patient.  No red flags.  Positive COVID-19.  Given patient medical history does qualify for antiviral therapy, medication list reviewed.  Will avoid Paxlovid given interactions and do molnupiravir  twice daily for 5 days.  Tessalon  as needed for cough.  Advise rest fluids and PCP follow-up if symptoms do not improve.  ER precautions reviewed. Final Clinical Impressions(s) / UC Diagnoses   Final diagnoses:  Acute cough  Exposure to COVID-19 virus  COVID-19     Discharge Instructions      You have tested positive for COVID-19.  Start molnupiravir  antiviral medication twice daily for 5 days.  This medication does not make COVID go away.  it helps reduce chance of complication and hospitalization.  Tessalon  as needed for cough.  Continue over-the-counter Tylenol  or ibuprofen as needed for fever management.  Lots of rest and fluids.  Please follow-up with your PCP if your symptoms do not improve.  Please go to the ER for any worsening symptoms.  I hope you feel better soon!     ED Prescriptions     Medication Sig Dispense Auth. Provider   benzonatate  (TESSALON ) 200 MG capsule Take 1 capsule (200 mg total) by mouth 3 (three) times daily  as needed. 20 capsule Aleria Maheu, Jodi R, NP   molnupiravir  EUA (LAGEVRIO ) 200 mg CAPS capsule Take 4 capsules (800 mg total) by mouth 2 (two) times daily for 5 days. 40 capsule Juleon Narang, Jodi R, NP      PDMP not reviewed this encounter.   Loreda Myla SAUNDERS, NP 08/19/24 [phone]

## 2024-08-19 NOTE — Discharge Instructions (Signed)
 You have tested positive for COVID-19.  Start molnupiravir  antiviral medication twice daily for 5 days.  This medication does not make COVID go away.  it helps reduce chance of complication and hospitalization.  Tessalon  as needed for cough.  Continue over-the-counter Tylenol  or ibuprofen as needed for fever management.  Lots of rest and fluids.  Please follow-up with your PCP if your symptoms do not improve.  Please go to the ER for any worsening symptoms.  I hope you feel better soon!

## 2024-08-23 ENCOUNTER — Ambulatory Visit: Admitting: Orthopaedic Surgery

## 2024-10-13 ENCOUNTER — Other Ambulatory Visit: Payer: Self-pay | Admitting: Internal Medicine

## 2024-10-13 DIAGNOSIS — E782 Mixed hyperlipidemia: Secondary | ICD-10-CM

## 2024-10-15 ENCOUNTER — Encounter: Payer: Self-pay | Admitting: Radiology

## 2024-11-04 ENCOUNTER — Ambulatory Visit
Admission: EM | Admit: 2024-11-04 | Discharge: 2024-11-04 | Disposition: A | Discharge status: Home / Self Care | Attending: Family Medicine | Admitting: Family Medicine

## 2024-11-04 DIAGNOSIS — H6992 Unspecified Eustachian tube disorder, left ear: Secondary | ICD-10-CM

## 2024-11-04 DIAGNOSIS — R070 Pain in throat: Secondary | ICD-10-CM | POA: Diagnosis not present

## 2024-11-04 MED ORDER — PREDNISONE 10 MG PO TABS: 30.0000 mg | ORAL_TABLET | Freq: Every day | ORAL | 0 refills | Status: DC

## 2024-11-04 NOTE — Discharge Instructions (Signed)
 Start prednisone  to help with eustachian tube dysfunction likely caused by a virus infection. Use Zyrtec as well. If you continue to have symptoms come Wednesday then let me know and we will address your symptoms for a bacterial upper respiratory infection, middle ear infection with an antibiotic.

## 2024-11-04 NOTE — ED Triage Notes (Signed)
 Pt c/o left ear ache, left side sore throat, drainage down back of throat-sx 5-6 days-denies fever-taking allergy meds-NAD-steady gait

## 2024-11-04 NOTE — ED Provider Notes (Signed)
 [facility]  Note:  This document was prepared using Conservation officer, historic buildings and may include unintentional dictation errors.  MRN: [id number] DOB: Apr 14, 1966  Subjective:   Allen Thomas is a 58 y.o. male presenting for 5 to 6-day history of persistent left-sided throat pain, left-sided sinus pain, left-sided ear pain.  Has had significant drainage. No fever, cough, chest pain, shob, wheezing.  Has a history of allergic rhinitis and has previously had to be on a nasal steroid, oral antihistamine.  No smoking of any kind including cigarettes, cigars, vaping, marijuana use.  Has type 2 diabetes treated without insulin with an A1c less than 7%.  No current facility-administered medications for this encounter.  Current Outpatient Medications:    benzonatate  (TESSALON ) 200 MG capsule, Take 1 capsule (200 mg total) by mouth 3 (three) times daily as needed., Disp: 20 capsule, Rfl: 0   betamethasone dipropionate (DIPROLENE) 0.05 % ointment, Apply 1 application topically daily., Disp: , Rfl:    Choline Fenofibrate  (FENOFIBRIC ACID ) 135 MG CPDR, Take 1 capsule by mouth daily at 6 (six) AM., Disp: 90 capsule, Rfl: 3   Coal Tar Extract 1.25 % GEL, Apply topically as needed. For psoriasis on elbows, Disp: , Rfl:    hydrocortisone 2.5 % cream, Apply topically 2 (two) times daily., Disp: , Rfl:    icosapent  Ethyl (VASCEPA ) 1 g capsule, TAKE 2 CAPSULES BY MOUTH TWICE A DAY, Disp: 360 capsule, Rfl: 0   JARDIANCE 25 MG TABS tablet, Take 25 mg by mouth daily., Disp: , Rfl:    lisinopril (ZESTRIL) 20 MG tablet, Take 20 mg by mouth daily., Disp: , Rfl:    metFORMIN (GLUCOPHAGE) 500 MG tablet, Take 1,000 mg by mouth 2 (two) times daily with a meal. , Disp: , Rfl:    PARoxetine (PAXIL) 40 MG tablet, Take 40 mg by mouth every evening. , Disp: , Rfl:    pioglitazone (ACTOS) 15 MG tablet, TAKE 1 TABLET BY MOUTH EVERY DAY, Disp: 90 tablet, Rfl: 3   predniSONE  (DELTASONE ) 10 MG  tablet, Take 3 tablets (30 mg total) by mouth daily with breakfast., Disp: 15 tablet, Rfl: 0   rosuvastatin  (CRESTOR ) 40 MG tablet, TAKE 1 TABLET BY MOUTH EVERY DAY, Disp: 90 tablet, Rfl: 1   No Known Allergies  Past Medical History:  Diagnosis Date   Anxiety    Depression    Diabetes mellitus    Type II   Elevated LFTs    Secondary to fatty liver   History of tobacco abuse    Hypertension    Mixed dyslipidemia    Obesity    OSA (obstructive sleep apnea)    Psoriasis    followed by Dermatologist Dr Joshua   Sleep apnea    settings at 11    Tinnitus 12/14/2007   followed by ENT Dr Jesus     Past Surgical History:  Procedure Laterality Date   BACK SURGERY     L5-S1 back surgery    CARDIAC CATHETERIZATION  2004   normal coronary arteries   KNEE ARTHROSCOPY  07/27/2012   Procedure: ARTHROSCOPY KNEE;  Surgeon: Lonni CINDERELLA Poli, MD;  Location: [facility];  Service: Orthopedics;  Laterality: Left;  Left knee arthroscopy with debridement and medial menisectomy    Family History  Problem Relation Age of Onset   CAD Mother    Stroke Brother     Social History   Tobacco Use   Smoking status: Former    Current  packs/day: 0.00    Types: Cigarettes    Quit date: 12/14/2003    Years since quitting: 20.9   Smokeless tobacco: Former  Building Services Engineer status: Never Used  Substance Use Topics   Alcohol use: Yes    Comment: occ   Drug use: No    ROS   Objective:   Vitals: BP 127/75 (BP Location: Right Arm)   Pulse 74   Temp 98.9 F (37.2 C) (Oral)   Resp 16   SpO2 97%   Physical Exam Constitutional:      General: He is not in acute distress.    Appearance: Normal appearance. He is well-developed and normal weight. He is not ill-appearing, toxic-appearing or diaphoretic.  HENT:     Head: Normocephalic and atraumatic.     Right Ear: Tympanic membrane, ear canal and external ear normal. No drainage, swelling or tenderness. No middle ear effusion. There is no  impacted cerumen. Tympanic membrane is not erythematous or bulging.     Left Ear: Tympanic membrane, ear canal and external ear normal. No drainage, swelling or tenderness.  No middle ear effusion. There is no impacted cerumen. Tympanic membrane is not erythematous or bulging.     Nose: Nose normal. No congestion or rhinorrhea.     Mouth/Throat:     Mouth: Mucous membranes are moist.     Pharynx: No pharyngeal swelling, oropharyngeal exudate, posterior oropharyngeal erythema or uvula swelling.     Tonsils: No tonsillar exudate or tonsillar abscesses. 0 on the right. 0 on the left.  Eyes:     General: No scleral icterus.       Right eye: No discharge.        Left eye: No discharge.     Extraocular Movements: Extraocular movements intact.     Conjunctiva/sclera: Conjunctivae normal.  Cardiovascular:     Rate and Rhythm: Normal rate.  Pulmonary:     Effort: Pulmonary effort is normal.  Musculoskeletal:     Cervical back: Normal range of motion and neck supple. No rigidity. No muscular tenderness.  Neurological:     General: No focal deficit present.     Mental Status: He is alert and oriented to person, place, and time.  Psychiatric:        Mood and Affect: Mood normal.        Behavior: Behavior normal.        Thought Content: Thought content normal.        Judgment: Judgment normal.     Assessment and Plan :   PDMP not reviewed this encounter.  1. Eustachian tube dysfunction, left   2. Throat pain    No signs of bacterial infection.  Likely has a viral respiratory infection eliciting his symptoms, eustachian tube dysfunction.  Offered a dose of prednisone  to address this at 30 mg for 5 days.  Can use supportive care otherwise.  Should patient continue to have symptoms come Wednesday when I return to the clinic, offered to treat with an oral antibiotic to address for bacterial upper respiratory infection, left middle ear infection.  Counseled patient on potential for adverse effects  with medications prescribed/recommended today, ER and return-to-clinic precautions discussed, patient verbalized understanding.    Christopher Savannah, [place] 11/04/24 [phone]

## 2024-11-21 ENCOUNTER — Encounter (HOSPITAL_BASED_OUTPATIENT_CLINIC_OR_DEPARTMENT_OTHER): Payer: Self-pay | Admitting: Internal Medicine

## 2024-11-26 ENCOUNTER — Telehealth: Payer: Self-pay | Admitting: Nurse Practitioner

## 2024-11-26 DIAGNOSIS — E782 Mixed hyperlipidemia: Secondary | ICD-10-CM

## 2024-11-26 DIAGNOSIS — Z79899 Other long term (current) drug therapy: Secondary | ICD-10-CM

## 2024-11-26 DIAGNOSIS — E119 Type 2 diabetes mellitus without complications: Secondary | ICD-10-CM

## 2024-11-26 DIAGNOSIS — I10 Essential (primary) hypertension: Secondary | ICD-10-CM

## 2024-11-26 NOTE — Telephone Encounter (Signed)
 NMR, lipoprofile (12/28/2023 16:04) Comp Met (CMET) (12/28/2023 16:05) HgB A1c (12/28/2023 16:05) Triglycerides (02/09/2024 07:24)   Last labs drawn earlier this year as shown above.  Next lipid visit Jan 02, 2025.

## 2024-11-26 NOTE — Telephone Encounter (Signed)
 Please order NMR, A1C, CMET  Thank you!

## 2024-11-26 NOTE — Telephone Encounter (Signed)
°  Patient has lipid appt scheduled for 01/02/25. Please place orders for lab work prior to appt.

## 2024-11-26 NOTE — Telephone Encounter (Signed)
 Will forward to Eula Fried NP for review

## 2024-11-26 NOTE — Telephone Encounter (Signed)
Advised patient and mailed lab orders  ?

## 2024-11-26 NOTE — Telephone Encounter (Signed)
°  Patient would also like to know if he can get his A1C checked at the same time

## 2024-12-12 ENCOUNTER — Encounter (HOSPITAL_BASED_OUTPATIENT_CLINIC_OR_DEPARTMENT_OTHER): Payer: Self-pay

## 2024-12-27 NOTE — Progress Notes (Unsigned)
 " Cardiology Office Note:  .   Date:  01/03/2025  ID:  Allen Thomas, DOB 03-14-1966, MRN [id number] PCP: [name], FNP  [facility] Providers Cardiologist:  [name], MD    Patient Profile: .      PMH Dyslipidemia Type 2 DM Mildly elevated liver enzymes Obesity Depression Anxiety OSA on CPAP   Referred to [facility] and seen by Dr. Maxcy 08/16/2019.  He underwent cardiac catheterization in 2012 which showed no significant obstructive coronary disease.  He has subsequently been followed by Dr. Shlomo and had coronary calcium  screening 2016 with a score of 0.  He had been managed on rosuvastatin , Lovaza, and fenofibrate  with lipid panel at the time that showed total cholesterol 138, HDL 28, LDL 62 and triglycerides 413.  Hemoglobin A1c was 7.4.  He was advised to switch from Lovaza to Vascepa  for additional cardiovascular benefit.  At follow-up, he had had recent neck surgery and admitted to being more sedentary.  Total cholesterol was 127, triglycerides 532, HDL 24, and LDL 30 (NIH calculated), direct LDL was 40.  He has continued to have fluctuations and lipids and A1c.  Last clinic visit was 09/06/2022 with total cholesterol 133, HDL 33, triglycerides 296, and LDL 54.  He was compliant on rosuvastatin  40 mg daily and Vascepa  2 g twice daily.  Hemoglobin A1c was 6.5% with weight loss, diet, and physical activity.  Seen in clinic by me on 09/28/2023. Previously followed by general cardiology but recently has only been seen by Dr. Maxcy for management of dyslipidemia. Reported overall feeling well. Less active over the past 4 years due to 2 neck surgeries. Generally walks on the weekends and does yard work. Gained a significant amount of weight on Paxil when he was in his 30s. Weight loss  over the past several years with particular improvement when he started Jardiance. He denied chest pain, shortness of breath, or other cardiac symptoms. He has a history of high  triglycerides, remaining elevated even when he is on medication. Lowest they have been is in the 200s. Concerned about potential impact of his medications on his liver and kidneys. Significant family history of heart disease which concerns him because his dad was asymptomatic. Occasional discomfort in his upper body and abdomen but no discomfort associated with exertion. Typically follows a healthy diet through the week but eats more french fries, biscuits and tater tots on the weekends. 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 Advised to have echo which revealed normal LVEF 60-65%, G1DD, normal RV, no significant valve disease. Exercise myoview  performed 11/08/23 revealed no evidence of ischemia or infarction, low risk study. Lipid panel 09/19/2023 with total cholesterol 163, triglycerides 400, HDL 31, LDL 69.  Seen in follow-up on 12/28/23 accompanied by his wife. Feeling well and trying to incorporate more exercise and better dietary habits. Is happy that he did not gain weight over the holiday season. Admits he does not sleep well due to being woken up by CPAP machine. Wife reports he falls asleep easily during the day. Strong family history of CAD with his father having CABG x 3 at age 57, but lived to age 52. Patient has been on statin since age 29.   Lengthy discussion about healthy diet, weight loss, and exercise goals for heart health, management of diabetes, and hypertension.  No concerning cardiac symptoms. NMR revealed LDL particle number 820, LDL-C 49, HDL-C 27, triglycerides 791, total cholesterol 189,  small LDL-P 718. Complaint with rosuvastatin , Vascepa , and fenofibrate . Advised to increase walking and avoid simple carbohydrates, saturated fat, and sugar. Go to ED if abdominal pain develops.   Repeat triglyceride level 02/09/24 improved to 460.   Discussed the use of AI scribe software for clinical note transcription with the patient, who gave verbal consent to  proceed.  History of Present Illness Today, he notes fluctuating blood sugar with use of continuous glucose monitoring. Spikes occur after high-carbohydrate meals such as pasta, rice, bread, and potatoes, so he is trying to limit these and eat more salads. He reports fatigue he believes is related to fluctuations in blood sugar. He found an ice cream that does not raise his blood sugar but worries it may worsen triglycerides. He has no abdominal pain unless he overeats. He does pushups and resistance band exercises but feels he does not walk enough. His wife notes he gets up early to work in order to better manage work stress, and he does stop to get some exercise at various times throughout the day.  He is having some gastrointestinal symptoms with postprandial flushing and feelings of urgent bowel movements after large meals.  He is concerned about dumping syndrome.  Admits to drinking possibly up to 24 ounces of regular coffee in the morning followed by several cups of decaf coffee.  He generally stops drinking coffee around 10 AM and then drinks water with Crystal light or plain. He denies chest pain, dyspnea, palpitations, orthopnea, PND, edema, presyncope or syncope. He discussed potential use of GLP1 agonist for control of diabetes and weight loss.   ROS: See HPI       Studies Reviewed: SABRA   EKG Interpretation Date/Time:  Wednesday January 02 2025 15:26:33 EST Ventricular Rate:  84 PR Interval:  170 QRS Duration:  118 QT Interval:  394 QTC Calculation: 465 R Axis:   -45  Text Interpretation: Sinus rhythm with occasional Premature ventricular complexes and Fusion complexes Left axis deviation Right bundle branch block Cannot rule out Inferior infarct , age undetermined Confirmed by Percy Browning [phone]) on 01/02/2025 3:35:59 PM     Risk Assessment/Calculations:          Physical Exam:   VS:  BP 120/74 (BP Location: Right Arm, Patient Position: Sitting, Cuff Size: Normal)   Pulse 84    Ht 5' 11 (1.803 m)   Wt 191 lb 9.6 oz (86.9 kg)   SpO2 97%   BMI 26.72 kg/m    Wt Readings from Last 3 Encounters:  01/02/25 191 lb 9.6 oz (86.9 kg)  02/14/24 195 lb (88.5 kg)  12/28/23 196 lb (88.9 kg)    GEN: Well nourished, well developed in no acute distress NECK: No JVD; No carotid bruits CARDIAC: RRR, no murmurs, rubs, gallops RESPIRATORY:  Clear to auscultation without rales, wheezing or rhonchi  ABDOMEN: Soft, non-tender, non-distended EXTREMITIES:  No edema; No deformity     ASSESSMENT AND PLAN: .    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 Exercise myoview  11/08/2023 was low risk.  Echocardiogram completed 10/20/2023 revealed normal LVEF 60 to 65%, G1 DD, mild LVH, no significant valve disease. He remains asymptomatic, with no concerning cardiac symptoms.  EKG today reveals sinus rhythm with occasional PVCs at 84 bpm, LAD, RBBB, cannot rule out inferior infarct, age undetermined, no significant change when compared with previous EKG. - No additional testing indicated at this time  Dyslipidemia LDL goal < 70 Hypertriglyceridemia Hepatic steatosis Lipid panel  12/28/2024 with LDL particle #1166, LDL-C 75, HDL-C 31, triglycerides 535, small LDL-P 992. Lipid profile not ideal given history of diabetes.  We discussed findings of elevated triglycerides and small particle number.  Discussed consideration of PCSK9 inhibitor therapy for further lipid-lowering benefit.  CGM has helped him make some dietary modifications to stabilize blood sugar and potentially improve triglyceride levels.  He is tolerating lipid-lowering medications without concerning side effects. Would like to discuss with Dr. Lonni given patient's complex metabolic syndrome.  -Consider starting PCSK9 inhibitor therapy -Consider starting GLP-1 agonist for potential improvement in lipids, particularly triglycerides - Continue fenofibrate , Vascepa , and Crestor   - Check Apo B and LPa  for further risk stratification  Cardiac risk Family hx early CAD Coronary calcium  score of 0 in 2016. Remote catheterization in 2012 with no significant coronary disease. Low risk exercise myoview  10/2023. He denies chest pain, dyspnea, or other symptoms concerning for angina.  No indication for further ischemic evaluation at this time.  - Continue measures for lipid and glucose management and heart healthy lifestyle  OSA on CPAP He is compliant with CPAP but says it wakes him during the night due to discomfort. Sent a request for an appointment with Dr. Shlomo for sleep management.  - Follow-up with Dr. Shlomo for sleep management  Diabetes A1c up to 6.7% on 12/28/24. He has CGM which has helped him make some dietary modifications. He asks about adding GLP-1 agonist, however he has some concerning postprandial symptoms.  - Consider referral to endocrinology - Continue Actos, metformin, Jardiance  Hypertension BP is well-controlled. Has mild LVH on echo.  Encouraged continued monitoring of BP and low sodium diet.  - Continue lisinopril         Disposition: TBD based on medication recommendations  Signed, Rosaline Bane, NP-C "

## 2024-12-28 ENCOUNTER — Encounter: Payer: Self-pay | Admitting: Cardiology

## 2024-12-30 ENCOUNTER — Ambulatory Visit (HOSPITAL_BASED_OUTPATIENT_CLINIC_OR_DEPARTMENT_OTHER): Payer: Self-pay | Admitting: Nurse Practitioner

## 2024-12-30 LAB — NMR, LIPOPROFILE
Cholesterol, Total: 190 mg/dL (ref 100–199)
HDL Particle Number: 28.8 umol/L — AB
HDL-C: 31 mg/dL — AB
LDL Particle Number: 1166 nmol/L — AB
LDL Size: 19.7 nm — AB
LDL-C (NIH Calc): 75 mg/dL (ref 0–99)
LP-IR Score: 81 — AB
Small LDL Particle Number: 992 nmol/L — AB
Triglycerides: 535 mg/dL — AB (ref 0–149)

## 2024-12-30 LAB — COMPREHENSIVE METABOLIC PANEL WITH GFR
ALT: 45 IU/L — ABNORMAL HIGH (ref 0–44)
AST: 30 IU/L (ref 0–40)
Albumin: 5.1 g/dL — ABNORMAL HIGH (ref 3.8–4.9)
Alkaline Phosphatase: 50 IU/L (ref 47–123)
BUN/Creatinine Ratio: 30 — ABNORMAL HIGH (ref 9–20)
BUN: 24 mg/dL (ref 6–24)
Bilirubin Total: 0.8 mg/dL (ref 0.0–1.2)
CO2: 21 mmol/L (ref 20–29)
Calcium: 10 mg/dL (ref 8.7–10.2)
Chloride: 97 mmol/L (ref 96–106)
Creatinine, Ser: 0.81 mg/dL (ref 0.76–1.27)
Globulin, Total: 2.8 g/dL (ref 1.5–4.5)
Glucose: 135 mg/dL — ABNORMAL HIGH (ref 70–99)
Potassium: 4.6 mmol/L (ref 3.5–5.2)
Sodium: 136 mmol/L (ref 134–144)
Total Protein: 7.9 g/dL (ref 6.0–8.5)
eGFR: 103 mL/min/1.73

## 2024-12-30 LAB — HEMOGLOBIN A1C
Est. average glucose Bld gHb Est-mCnc: 146 mg/dL
Hgb A1c MFr Bld: 6.7 % — ABNORMAL HIGH (ref 4.8–5.6)

## 2025-01-02 ENCOUNTER — Ambulatory Visit (INDEPENDENT_AMBULATORY_CARE_PROVIDER_SITE_OTHER): Admitting: Nurse Practitioner

## 2025-01-02 ENCOUNTER — Encounter (HOSPITAL_BASED_OUTPATIENT_CLINIC_OR_DEPARTMENT_OTHER): Payer: Self-pay | Admitting: Nurse Practitioner

## 2025-01-02 VITALS — BP 120/74 | HR 84 | Ht 71.0 in | Wt 191.6 lb

## 2025-01-02 DIAGNOSIS — I10 Essential (primary) hypertension: Secondary | ICD-10-CM | POA: Diagnosis not present

## 2025-01-02 DIAGNOSIS — E119 Type 2 diabetes mellitus without complications: Secondary | ICD-10-CM | POA: Diagnosis not present

## 2025-01-02 DIAGNOSIS — I4510 Unspecified right bundle-branch block: Secondary | ICD-10-CM | POA: Diagnosis not present

## 2025-01-02 DIAGNOSIS — E782 Mixed hyperlipidemia: Secondary | ICD-10-CM

## 2025-01-02 DIAGNOSIS — Z7189 Other specified counseling: Secondary | ICD-10-CM | POA: Diagnosis not present

## 2025-01-02 DIAGNOSIS — E781 Pure hyperglyceridemia: Secondary | ICD-10-CM | POA: Diagnosis not present

## 2025-01-02 DIAGNOSIS — G4733 Obstructive sleep apnea (adult) (pediatric): Secondary | ICD-10-CM

## 2025-01-02 DIAGNOSIS — Z8249 Family history of ischemic heart disease and other diseases of the circulatory system: Secondary | ICD-10-CM

## 2025-01-02 DIAGNOSIS — I447 Left bundle-branch block, unspecified: Secondary | ICD-10-CM

## 2025-01-02 NOTE — Patient Instructions (Signed)
 Medication Instructions:  Your physician recommends that you continue on your current medications as directed. Please refer to the Current Medication list given to you today.   Labwork: NONE  Testing/Procedures: NONE  Follow-Up: 6 MONTHS WITH MICHELLE S NP   Any Other Special Instructions Will Be Listed Below (If Applicable). MICHELLE WILL CALL YOU WITH FURTHER RECOMMENDATIONS   If you need a refill on your cardiac medications before your next appointment, please call your pharmacy.

## 2025-01-03 ENCOUNTER — Encounter (HOSPITAL_BASED_OUTPATIENT_CLINIC_OR_DEPARTMENT_OTHER): Payer: Self-pay | Admitting: Nurse Practitioner

## 2025-01-15 ENCOUNTER — Encounter (HOSPITAL_BASED_OUTPATIENT_CLINIC_OR_DEPARTMENT_OTHER): Payer: Self-pay

## 2025-01-15 ENCOUNTER — Other Ambulatory Visit (HOSPITAL_BASED_OUTPATIENT_CLINIC_OR_DEPARTMENT_OTHER): Payer: Self-pay | Admitting: Internal Medicine

## 2025-01-16 ENCOUNTER — Other Ambulatory Visit: Payer: Self-pay | Admitting: Internal Medicine

## 2025-01-16 DIAGNOSIS — E782 Mixed hyperlipidemia: Secondary | ICD-10-CM

## 2025-01-17 ENCOUNTER — Telehealth: Payer: Self-pay | Admitting: Pharmacy Technician

## 2025-01-17 NOTE — Telephone Encounter (Signed)
" ° °  Pharmacy Patient Advocate Encounter   Received notification from [facility] KEY that prior authorization for vascepa  is required/requested.   Insurance verification completed.   The patient is insured through [facility].   Per test claim: PA required; PA submitted to above mentioned insurance via Latent Key/confirmation #/EOC [id number] Status is pending  "

## 2025-01-17 NOTE — Telephone Encounter (Signed)
 Labs Completed on 12/28/24. Overdue CBC Labs  In accordance with refill protocols, please review and address the following requirements before this medication refill can be authorized:  Labs

## 2025-01-18 NOTE — Telephone Encounter (Signed)
 Pharmacy Patient Advocate Encounter  Received notification from [facility] that Prior Authorization for vascepa  has been APPROVED from 01/18/25 to 01/18/28   PA #/Case ID/Reference #: [id number]

## 2025-02-15 ENCOUNTER — Ambulatory Visit: Admitting: Cardiology

## 2025-03-28 ENCOUNTER — Ambulatory Visit: Admitting: Cardiology
